# Patient Record
Sex: Female | Born: 1995
Health system: Southern US, Community
[De-identification: ages and names within clinical notes are randomized; demographics above are authoritative.]

## PROBLEM LIST (undated history)

## (undated) ENCOUNTER — Ambulatory Visit: Payer: Self-pay

## (undated) ENCOUNTER — Ambulatory Visit: Admission: EM | Payer: Self-pay

## (undated) ENCOUNTER — Ambulatory Visit: Payer: Self-pay | Source: Home / Self Care

## (undated) DIAGNOSIS — T7840XA Allergy, unspecified, initial encounter: Secondary | ICD-10-CM

## (undated) DIAGNOSIS — L732 Hidradenitis suppurativa: Secondary | ICD-10-CM

## (undated) DIAGNOSIS — L709 Acne, unspecified: Secondary | ICD-10-CM

## (undated) HISTORY — DX: Allergy, unspecified, initial encounter: T78.40XA

## (undated) HISTORY — DX: Acne, unspecified: L70.9

---
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
## 2013-01-25 ENCOUNTER — Encounter (HOSPITAL_COMMUNITY): Payer: Self-pay | Admitting: *Deleted

## 2013-01-25 ENCOUNTER — Emergency Department (HOSPITAL_COMMUNITY)
Admission: EM | Admit: 2013-01-25 | Discharge: 2013-01-25 | Disposition: A | Discharge status: Home / Self Care | Payer: Medicaid Other | Attending: Emergency Medicine | Admitting: Emergency Medicine

## 2013-01-25 DIAGNOSIS — J45909 Unspecified asthma, uncomplicated: Secondary | ICD-10-CM | POA: Insufficient documentation

## 2013-01-25 DIAGNOSIS — L0291 Cutaneous abscess, unspecified: Secondary | ICD-10-CM

## 2013-01-25 DIAGNOSIS — Z872 Personal history of diseases of the skin and subcutaneous tissue: Secondary | ICD-10-CM | POA: Insufficient documentation

## 2013-01-25 DIAGNOSIS — Z79899 Other long term (current) drug therapy: Secondary | ICD-10-CM | POA: Insufficient documentation

## 2013-01-25 DIAGNOSIS — L02219 Cutaneous abscess of trunk, unspecified: Secondary | ICD-10-CM | POA: Insufficient documentation

## 2013-01-25 MED ORDER — HYDROCODONE-ACETAMINOPHEN 5-325 MG PO TABS
1.0000 | ORAL_TABLET | Freq: Once | ORAL | Status: AC
  Administered 2013-01-25: 1 via ORAL
  Filled 2013-01-25: qty 1

## 2013-01-25 MED ORDER — SULFAMETHOXAZOLE-TRIMETHOPRIM 800-160 MG PO TABS: 1.0000 | ORAL_TABLET | Freq: Two times a day (BID) | ORAL | Status: DC

## 2013-01-25 MED ORDER — HYDROCODONE-ACETAMINOPHEN 5-325 MG PO TABS: ORAL_TABLET | ORAL | Status: DC

## 2013-01-25 NOTE — ED Provider Notes (Signed)
Medical screening examination/treatment/procedure(s) were performed by non-physician practitioner and as supervising physician I was immediately available for consultation/collaboration. Emalea Mix, MD, FACEP   Tonna Palazzi L Iya Hamed, MD 01/25/13 1608 

## 2013-01-25 NOTE — ED Notes (Signed)
Pt reports she started to have pain in her belly button Thursday where her piercing is. Pt reports possible infection.  Swelling and redness noted.

## 2013-01-25 NOTE — ED Provider Notes (Signed)
History    CSN: [id number] Arrival date & time 01/25/13  1104  First MD Initiated Contact with Patient 01/25/13 1119     Chief Complaint  Patient presents with  . Navel pain/piercing infected    (Consider location/radiation/quality/duration/timing/severity/associated sxs/prior Treatment) HPI Comments: Patient presents with complaint of naval swelling and pain that began 3 days ago. Patient had a piercing placed in this area several months ago. She is concerned of infection. No treatments prior to arrival. Area has not been draining. No fever or vomiting. Onset of symptoms gradual. Course is gradually worsening. Nothing makes symptoms better. Palpation makes pain worse.  The history is provided by the patient.   Past Medical History  Diagnosis Date  . 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 Acne     Duac gel per Derm, Dr. Nita Sells  . Asthma 2009    No symptoms since 2009. No med use since 2009   History reviewed. No pertinent past surgical history. Family History  Problem Relation Age of Onset  . Asthma Sister   . Stroke Maternal Grandfather   . Cancer Neg Hx   . Diabetes Neg Hx   . Heart disease Neg Hx   . Hyperlipidemia Neg Hx   . Hypertension Neg Hx    History  Substance Use Topics  . Smoking status: Never Smoker   . Smokeless tobacco: Never Used  . Alcohol Use: No   OB History   Grav Para Term Preterm Abortions TAB SAB Ect Mult Living                 Review of Systems  Constitutional: Negative for fever.  Gastrointestinal: Negative for nausea and vomiting.  Skin: Positive for color change.       Positive for abscess.  Hematological: Negative for adenopathy.    Allergies  Review of patient's allergies indicates no known allergies.  Home Medications   Current Outpatient Rx  Name  Route  Sig  Dispense  Refill  . EXPIRED: fluticasone (FLONASE) 50 MCG/ACT nasal spray   Nasal   Place 2 sprays into the nose daily.   16 g   12   .  HYDROcodone-acetaminophen (NORCO/VICODIN) 5-325 MG per tablet      Take 1-2 tablets every 6 hours as needed for severe pain   8 tablet   0   . EXPIRED: loratadine (CLARITIN) 10 MG tablet   Oral   Take 1 tablet (10 mg total) by mouth daily.   30 tablet   12   . sulfamethoxazole-trimethoprim (SEPTRA DS) 800-160 MG per tablet   Oral   Take 1 tablet by mouth every 12 (twelve) hours.   14 tablet   0    BP 118/65  Pulse 95  Temp(Src) 99 F (37.2 C) (Oral)  Resp 20  SpO2 100%  LMP 01/04/2013 Physical Exam  Nursing note and vitals reviewed. Constitutional: She appears well-developed and well-nourished.  HENT:  Head: Normocephalic and atraumatic.  Eyes: Conjunctivae are normal.  Neck: Normal range of motion. Neck supple.  Pulmonary/Chest: No respiratory distress.  Neurological: She is alert.  Skin: Skin is warm and dry.  There are 2 small areas of swelling where the piercing had entered and exited the skin at the inferior aspect of naval. These 2 areas are very fluctuant and tender. There is mild surrounding extension of the erythema consistent with cellulitis.  Psychiatric: She has a normal mood and affect.    ED Course  Procedures (including critical care time) Labs Reviewed - No data to display No results found. 1. Abscess and cellulitis    12:08 PM Patient seen and examined. Medications ordered.   Vital signs reviewed and are as follows: Filed Vitals:   01/25/13 1128  BP: 118/65  Pulse: 95  Temp: 99 F (37.2 C)  Resp: 20   INCISION AND DRAINAGE Performed by: Carolee Rota Consent: Verbal consent obtained. Risks and benefits: risks, benefits and alternatives were discussed Type: abscess  Body area: inferior aspect of umbilicus  Anesthesia: local infiltration  Incision was made with a 25g needle  Local anesthetic: lidocaine 2% without epinephrine  Anesthetic total: 1 ml  Complexity: complex  Drainage: purulent  Drainage amount: large  Packing  material: none  Patient tolerance: Patient tolerated the procedure well with no immediate complications.   12:08 PM The patient was urged to return to the Emergency Department urgently with worsening pain, swelling, expanding erythema especially if it streaks away from the affected area, fever, or if they have any other concerns.   The patient was urged to return to the Emergency Department or go to their PCP in 48 hours for wound recheck if the area is not significantly improved.  Counseled to take entire course of antibiotics as directed.  The patient verbalized understanding and stated agreement with this plan.   Patient counseled on use of narcotic pain medications. Counseled not to combine these medications with others containing tylenol. Urged not to drink alcohol, drive, or perform any other activities that requires focus while taking these medications. The patient verbalizes understanding and agrees with the plan.    MDM  Patient with abscess and cellulitis related to umbilicus piercing. I&D performed. Will treat with antibiotics given previous foreign body (now removed) and cellulitis. Patient appears well, nontoxic. No indication for parenteral antibiotics or admission to hospital.  Renne Crigler, PA-C 01/25/13 1214

## 2013-06-03 ENCOUNTER — Emergency Department (HOSPITAL_COMMUNITY): Payer: BC Managed Care – PPO

## 2013-06-03 ENCOUNTER — Encounter (HOSPITAL_COMMUNITY): Payer: Self-pay | Admitting: Emergency Medicine

## 2013-06-03 ENCOUNTER — Emergency Department (HOSPITAL_COMMUNITY)
Admission: EM | Admit: 2013-06-03 | Discharge: 2013-06-03 | Disposition: A | Discharge status: Home / Self Care | Payer: BC Managed Care – PPO | Attending: Emergency Medicine | Admitting: Emergency Medicine

## 2013-06-03 DIAGNOSIS — Y9389 Activity, other specified: Secondary | ICD-10-CM | POA: Insufficient documentation

## 2013-06-03 DIAGNOSIS — Y929 Unspecified place or not applicable: Secondary | ICD-10-CM | POA: Insufficient documentation

## 2013-06-03 DIAGNOSIS — IMO0002 Reserved for concepts with insufficient information to code with codable children: Secondary | ICD-10-CM | POA: Insufficient documentation

## 2013-06-03 DIAGNOSIS — S90122A Contusion of left lesser toe(s) without damage to nail, initial encounter: Secondary | ICD-10-CM

## 2013-06-03 DIAGNOSIS — S90129A Contusion of unspecified lesser toe(s) without damage to nail, initial encounter: Secondary | ICD-10-CM | POA: Insufficient documentation

## 2013-06-03 DIAGNOSIS — J45909 Unspecified asthma, uncomplicated: Secondary | ICD-10-CM | POA: Insufficient documentation

## 2013-06-03 DIAGNOSIS — Z872 Personal history of diseases of the skin and subcutaneous tissue: Secondary | ICD-10-CM | POA: Insufficient documentation

## 2013-06-03 MED ORDER — IBUPROFEN 800 MG PO TABS: 800.0000 mg | ORAL_TABLET | Freq: Three times a day (TID) | ORAL | Status: DC

## 2013-06-03 MED ORDER — HYDROCODONE-ACETAMINOPHEN 5-325 MG PO TABS
1.0000 | ORAL_TABLET | Freq: Once | ORAL | Status: AC
  Administered 2013-06-03: 1 via ORAL
  Filled 2013-06-03: qty 1

## 2013-06-03 NOTE — ED Notes (Signed)
Pt states she "banged" her L foot on a door today. Pt has pain to L pinkie toe. Pt arrives with toe buddy taped. Pt ambulatory with steady gait to room.

## 2013-06-03 NOTE — ED Provider Notes (Signed)
CSN: [id number]     Arrival date & time 06/03/13  2051 History  This chart was scribed for non-physician practitioner Kyung Bacca, PA-C working with Derwood Kaplan, MD by Caryn Bee, ED Scribe. This patient was seen in room WTR9/WTR9 and the patient's care was started at 10:03 PM.    Chief Complaint  Patient presents with  . Toe Injury   HPI HPI Comments: Caroline Hobbs is a 17 y.o. female who presents to the Emergency Department complaining of sudden onset, constant left small toe pain onset today after hitting it on the door. Pain is worsened by movement and bearing weight. She has lost some feeling at the tip of her toe. Pt took tylenol around 6:00 PM with no relief.   Past Medical History  Diagnosis Date  . 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 Acne     Duac gel per Derm, Dr. Nita Sells  . Asthma 2009    No symptoms since 2009. No med use since 2009   History reviewed. No pertinent past surgical history. Family History  Problem Relation Age of Onset  . Asthma Sister   . Stroke Maternal Grandfather   . Cancer Neg Hx   . Diabetes Neg Hx   . Heart disease Neg Hx   . Hyperlipidemia Neg Hx   . Hypertension Neg Hx    History  Substance Use Topics  . Smoking status: Never Smoker   . Smokeless tobacco: Never Used  . Alcohol Use: No   OB History   Grav Para Term Preterm Abortions TAB SAB Ect Mult Living                 Review of Systems  All other systems reviewed and are negative.    Allergies  Review of patient's allergies indicates no known allergies.  Home Medications   Current Outpatient Rx  Name  Route  Sig  Dispense  Refill  . EXPIRED: loratadine (CLARITIN) 10 MG tablet   Oral   Take 1 tablet (10 mg total) by mouth daily.   30 tablet   12    Triage Vitals: BP 138/73  Pulse 88  Temp(Src) 98.7 F (37.1 C) (Oral)  Resp 16  SpO2 100%  Physical Exam  Musculoskeletal:  L pinky toe edematous and ecchymotic.  Tenderness entire toe  as well as base of 5th metatarsal.  Pain w/ minimal passive flexion of toe.  Distal sensation decreased.  Rest of toes and foot nml.      ED Course  Procedures (including critical care time) DIAGNOSTIC STUDIES: Oxygen Saturation is 100% on room air, normal by my interpretation.    COORDINATION OF CARE: 10:08 PM-Discussed treatment plan with pt at bedside and pt agreed to plan.   Labs Review Labs Reviewed - No data to display Imaging Review Dg Toe 5th Left  06/03/2013   CLINICAL DATA:  Kicked door with left foot, pain in 5th toe  EXAM: DG TOE 5TH LEFT  COMPARISON:  None.  FINDINGS: There is no evidence of fracture or dislocation. There is no evidence of arthropathy or other focal bone abnormality. Soft tissues are unremarkable.  IMPRESSION: Negative.   Electronically Signed   By: Esperanza Heir M.D.   On: 06/03/2013 21:58    EKG Interpretation   None       MDM   1. Contusion, toe, left, initial encounter    [name] presents w/ L toe injury.  Xray neg  for fx/dislocation.  Ortho tech buddy taped and fitted her with post-op shoe for comfort.  Recommended NSAID and RICE.  10:13 PM   I personally performed the services described in this documentation, which was scribed in my presence. The recorded information has been reviewed and is accurate.    Otilio Miu, PA-C 06/03/13 2215

## 2013-06-03 NOTE — ED Notes (Signed)
Pt has a ride home.  

## 2013-06-06 NOTE — ED Provider Notes (Signed)
Medical screening examination/treatment/procedure(s) were performed by non-physician practitioner and as supervising physician I was immediately available for consultation/collaboration.  EKG Interpretation   None        Derwood Kaplan, MD 06/06/13 [id number]

## 2013-08-19 ENCOUNTER — Telehealth (HOSPITAL_COMMUNITY): Payer: Self-pay | Admitting: Emergency Medicine

## 2013-08-19 ENCOUNTER — Other Ambulatory Visit (HOSPITAL_COMMUNITY)
Admission: RE | Admit: 2013-08-19 | Discharge: 2013-08-19 | Disposition: A | Discharge status: Home / Self Care | Payer: PRIVATE HEALTH INSURANCE | Source: Ambulatory Visit | Attending: Emergency Medicine | Admitting: Emergency Medicine

## 2013-08-19 ENCOUNTER — Encounter (HOSPITAL_COMMUNITY): Payer: Self-pay | Admitting: Emergency Medicine

## 2013-08-19 ENCOUNTER — Emergency Department (INDEPENDENT_AMBULATORY_CARE_PROVIDER_SITE_OTHER)
Admission: EM | Admit: 2013-08-19 | Discharge: 2013-08-19 | Disposition: A | Discharge status: Home / Self Care | Payer: Self-pay | Source: Home / Self Care | Attending: Emergency Medicine | Admitting: Emergency Medicine

## 2013-08-19 DIAGNOSIS — A088 Other specified intestinal infections: Secondary | ICD-10-CM

## 2013-08-19 DIAGNOSIS — A084 Viral intestinal infection, unspecified: Secondary | ICD-10-CM

## 2013-08-19 DIAGNOSIS — Z113 Encounter for screening for infections with a predominantly sexual mode of transmission: Secondary | ICD-10-CM | POA: Insufficient documentation

## 2013-08-19 DIAGNOSIS — N760 Acute vaginitis: Secondary | ICD-10-CM | POA: Insufficient documentation

## 2013-08-19 LAB — CBC WITH DIFFERENTIAL/PLATELET
BAND NEUTROPHILS: 0 % (ref 0–10)
BASOS PCT: 0 % (ref 0–1)
BLASTS: 0 %
Basophils Absolute: 0 10*3/uL (ref 0.0–0.1)
Eosinophils Absolute: 0 10*3/uL (ref 0.0–0.7)
Eosinophils Relative: 0 % (ref 0–5)
HEMATOCRIT: 39.7 % (ref 36.0–46.0)
HEMOGLOBIN: 12.8 g/dL (ref 12.0–15.0)
LYMPHS ABS: 11.8 10*3/uL — AB (ref 0.7–4.0)
LYMPHS PCT: 85 % — AB (ref 12–46)
MCH: 24 pg — ABNORMAL LOW (ref 26.0–34.0)
MCHC: 32.2 g/dL (ref 30.0–36.0)
MCV: 74.3 fL — ABNORMAL LOW (ref 78.0–100.0)
MONO ABS: 0.3 10*3/uL (ref 0.1–1.0)
MONOS PCT: 2 % — AB (ref 3–12)
Metamyelocytes Relative: 0 %
Myelocytes: 0 %
Neutro Abs: 1.8 10*3/uL (ref 1.7–7.7)
Neutrophils Relative %: 13 % — ABNORMAL LOW (ref 43–77)
Platelets: 150 10*3/uL (ref 150–400)
Promyelocytes Absolute: 0 %
RBC: 5.34 MIL/uL — AB (ref 3.87–5.11)
RDW: 13.1 % (ref 11.5–15.5)
WBC: 13.9 10*3/uL — AB (ref 4.0–10.5)
nRBC: 0 /100 WBC

## 2013-08-19 LAB — POCT URINALYSIS DIP (DEVICE)
GLUCOSE, UA: NEGATIVE mg/dL
HGB URINE DIPSTICK: NEGATIVE
Nitrite: NEGATIVE
Protein, ur: 100 mg/dL — AB
Specific Gravity, Urine: >=1.03 (ref 1.005–1.030)
Urobilinogen, UA: >=8 mg/dL (ref 0.0–1.0)
pH: 5.5 (ref 5.0–8.0)

## 2013-08-19 LAB — POCT PREGNANCY, URINE: Preg Test, Ur: NEGATIVE

## 2013-08-19 MED ORDER — DICYCLOMINE HCL 20 MG PO TABS: 20.0000 mg | ORAL_TABLET | Freq: Two times a day (BID) | ORAL | Status: DC

## 2013-08-19 MED ORDER — METRONIDAZOLE 500 MG PO TABS: 500.0000 mg | ORAL_TABLET | Freq: Two times a day (BID) | ORAL | Status: DC

## 2013-08-19 MED ORDER — FLUCONAZOLE 150 MG PO TABS: 150.0000 mg | ORAL_TABLET | Freq: Once | ORAL | Status: DC

## 2013-08-19 MED ORDER — DIPHENOXYLATE-ATROPINE 2.5-0.025 MG PO TABS: 1.0000 | ORAL_TABLET | Freq: Four times a day (QID) | ORAL | Status: DC | PRN

## 2013-08-19 NOTE — ED Notes (Signed)
Lower abdominal pain R>L; NAD, w/d/color good

## 2013-08-19 NOTE — ED Notes (Signed)
DNA probes for Gardnerella and Candida were positive. She was not treated, so we will need to treat with metronidazole 500 mg #14 1 twice a day for 1 week, and Diflucan 150 mg #1, one time only. We will need to call and inform the patient of this result. Prescription to be sent to her pharmacy at [facility] [address]  Reuben Likesavid C Sanayah Munro, MD 08/19/13 2215

## 2013-08-19 NOTE — Discharge Instructions (Signed)

## 2013-08-19 NOTE — ED Provider Notes (Signed)
Chief Complaint   Chief Complaint  Patient presents with  . Abdominal Pain    History of Present Illness   Caroline Hobbs is a 18 year old female who has had a two-day history of fever up to 101.2, chills, myalgias, crampy, bilateral lower, pain, headache, rhinorrhea, diarrhea with a small amount of blood in, vaginal itching, discharge, and older. She denies any nausea, vomiting, anorexia, or urinary symptoms. No abnormal vaginal bleeding. Patient's last menstrual period began December 31. She is sexually active without use of birth control.  Review of Systems   Other than as noted above, the patient denies any of the following symptoms: Constitutional:  No fever, chills, fatigue, weight loss or anorexia. Lungs:  No cough or shortness of breath. Heart:  No chest pain, palpitations, syncope or edema.  No cardiac history. Abdomen:  No nausea, vomiting, hematememesis, melena, diarrhea, or hematochezia. GU:  No dysuria, frequency, urgency, or hematuria. Gyn:  No vaginal discharge, itching, abnormal bleeding, dyspareunia, or pelvic pain.  PMFSH   Past medical history, family history, social history, meds, and allergies were reviewed along with nurse's notes.  No prior abdominal surgeries, past history of GI problems, STDs or GYN problems.  No history of aspirin or NSAID use.  No excessive alcohol intake.   Physical Exam     Vital signs:  BP 109/74  Pulse 92  Temp(Src) 98.1 F (36.7 C) (Oral)  Resp 18  SpO2 100% Gen:  Alert, oriented, in no distress. Lungs:  Breath sounds clear and equal bilaterally.  No wheezes, rales or rhonchi. Heart:  Regular rhythm.  No gallops or murmers.   Abdomen:  Soft, flat, nondistended. No organomegaly or mass. Bowel sounds are hyperactive. She has mild tenderness to palpation in the suprapubic area and in the right lower quadrant without guarding or rebound. No organomegaly or mass. Pelvic:  Normal external genitalia, there was a small amount of  vaginal discharge, cervix and uterus were normal. There was no pain on cervical motion. Uterus was normal in size and shape and nontender. No adnexal tenderness or mass. DNA probe for gonorrhea, Chlamydia, Trichomonas, Gardnerella, Candida were obtained. Skin:  Clear, warm and dry.  No rash.  Labs   Results for orders placed during the hospital encounter of 08/19/13  CBC WITH DIFFERENTIAL      Result Value Range   WBC 13.9 (*) 4.0 - 10.5 K/uL   RBC 5.34 (*) 3.87 - 5.11 MIL/uL   Hemoglobin 12.8  12.0 - 15.0 g/dL   HCT 16.1  09.6 - 04.5 %   MCV 74.3 (*) 78.0 - 100.0 fL   MCH 24.0 (*) 26.0 - 34.0 pg   MCHC 32.2  30.0 - 36.0 g/dL   RDW 40.9  81.1 - 91.4 %   Platelets 150  150 - 400 K/uL   Neutrophils Relative % 13 (*) 43 - 77 %   Lymphocytes Relative 85 (*) 12 - 46 %   Monocytes Relative 2 (*) 3 - 12 %   Eosinophils Relative 0  0 - 5 %   Basophils Relative 0  0 - 1 %   Band Neutrophils 0  0 - 10 %   Metamyelocytes Relative 0     Myelocytes 0     Promyelocytes Absolute 0     Blasts 0     nRBC 0  0 /100 WBC   Neutro Abs 1.8  1.7 - 7.7 K/uL   Lymphs Abs 11.8 (*) 0.7 - 4.0 K/uL   Monocytes  Absolute 0.3  0.1 - 1.0 K/uL   Eosinophils Absolute 0.0  0.0 - 0.7 K/uL   Basophils Absolute 0.0  0.0 - 0.1 K/uL   WBC Morphology ATYPICAL LYMPHOCYTES    POCT URINALYSIS DIP (DEVICE)      Result Value Range   Glucose, UA NEGATIVE  NEGATIVE mg/dL   Bilirubin Urine MODERATE (*) NEGATIVE   Ketones, ur TRACE (*) NEGATIVE mg/dL   Specific Gravity, Urine >=1.030  1.005 - 1.030   Hgb urine dipstick NEGATIVE  NEGATIVE   pH 5.5  5.0 - 8.0   Protein, ur 100 (*) NEGATIVE mg/dL   Urobilinogen, UA >=8.2>=8.0  0.0 - 1.0 mg/dL   Nitrite NEGATIVE  NEGATIVE   Leukocytes, UA TRACE (*) NEGATIVE  POCT PREGNANCY, URINE      Result Value Range   Preg Test, Ur NEGATIVE  NEGATIVE    Assessment   The encounter diagnosis was Viral gastroenteritis.  Her white count was mildly elevated, however there was a lymphocyte  predominance suggesting a viral condition. She also has a mild vaginal discharge. Will await results of DNA testing.  Plan     1.  Meds:  The following meds were prescribed:   Discharge Medication List as of 08/19/2013  3:05 PM    START taking these medications   Details  dicyclomine (BENTYL) 20 MG tablet Take 1 tablet (20 mg total) by mouth 2 (two) times daily., Starting 08/19/2013, Until Discontinued, Normal    diphenoxylate-atropine (LOMOTIL) 2.5-0.025 MG per tablet Take 1 tablet by mouth 4 (four) times daily as needed for diarrhea or loose stools., Starting 08/19/2013, Until Discontinued, Print        2.  Patient Education/Counseling:  The patient was given appropriate handouts, self care instructions, and instructed in symptomatic relief.  Suggested clear liquids today, advancing to a brat diet tomorrow.  3.  Follow up:  The patient was told to follow up here if no better in 3 to 4 days, or sooner if becoming worse in any way, and given some red flag symptoms such as worsening pain, fever, vomiting, or evidence of GI bleeding which would prompt immediate return.  Follow up here as necessary.    Reuben Likesavid C Nylen Creque, MD 08/19/13 2214

## 2013-08-20 LAB — PATHOLOGIST SMEAR REVIEW: PATH REVIEW: REACTIVE

## 2013-08-21 ENCOUNTER — Telehealth (HOSPITAL_COMMUNITY): Payer: Self-pay | Admitting: *Deleted

## 2013-08-21 NOTE — ED Notes (Signed)
GC/Chlamydia neg., Affirm: Candida and Gardnerella pos., Trich neg.  Dr. Lorenz CoasterKeller e-prescribed Diflucan and Flagyl to pt.'s pharmacy.  I called pt. Pt. verified x 2 and given results.  Pt. told she needs Diflucan for yeast and Flagyl for bacterial vaginosis and where to pick up her Rx.'s.   Pt. instructed to no alcohol while taking the Flagyl.  Pt.'s questions answered. Pt. voiced understanding. Vassie MoselleYork, Tylea Hise M 08/21/2013

## 2013-08-25 NOTE — ED Notes (Signed)
1/25 No further action needed with path smear per Dr. Lorenz CoasterKeller. Caroline Hobbs, [name] M

## 2014-02-12 ENCOUNTER — Encounter (HOSPITAL_COMMUNITY): Payer: Self-pay | Admitting: Emergency Medicine

## 2014-02-12 ENCOUNTER — Emergency Department (INDEPENDENT_AMBULATORY_CARE_PROVIDER_SITE_OTHER)
Admission: EM | Admit: 2014-02-12 | Discharge: 2014-02-12 | Discharge status: Against Medical Advice | Payer: PRIVATE HEALTH INSURANCE | Source: Home / Self Care | Attending: Family Medicine | Admitting: Family Medicine

## 2014-02-12 DIAGNOSIS — R51 Headache: Secondary | ICD-10-CM

## 2014-02-12 DIAGNOSIS — R519 Headache, unspecified: Secondary | ICD-10-CM

## 2014-02-12 DIAGNOSIS — H53149 Visual discomfort, unspecified: Secondary | ICD-10-CM

## 2014-02-12 MED ORDER — ACETAMINOPHEN 325 MG PO TABS
ORAL_TABLET | ORAL | Status: AC
  Filled 2014-02-12: qty 3

## 2014-02-12 MED ORDER — ACETAMINOPHEN 500 MG PO TABS
1000.0000 mg | ORAL_TABLET | Freq: Once | ORAL | Status: AC
  Administered 2014-02-12: 1000 mg via ORAL

## 2014-02-12 NOTE — ED Notes (Signed)
Pt  Given   A  Note    Saying  She  Was  Here  Only        And  ama  Form  Signed  And  Scanned  Into  The  Record

## 2014-02-12 NOTE — ED Notes (Signed)
When    Caroline Hobbs  Took the  Pt to  The  Er        By  Shuttle       Pt  Refused      And    Lorriane Shirebraham  Brought pt  Back  And  Pt  Was   Placed  In  Exam room and  zack    Spoke  To  The  Pt

## 2014-02-12 NOTE — ED Notes (Signed)
Pt  Requested  A  Note  For  Work     -  She was  Advised  That  When she  Was   Discharged    The  Er would  Give  Her a  Work  Note  Depending  On  What  The  Findings  Of  Her  evaualtion  Was     - pt  Stated  She  Was  Not   Going  To stay in the  Er  All  Day   -  Pt  Advised  The  Importance     Of  Completing  The     Plan of  Care    And  Not  To leave

## 2014-02-12 NOTE — ED Provider Notes (Signed)
CSN: [id number]     Arrival date & time 02/12/14  1234 History   First MD Initiated Contact with Patient 02/12/14 1244     Chief Complaint  Patient presents with  . Headache   (Consider location/radiation/quality/duration/timing/severity/associated sxs/prior Treatment) HPI Comments: 18 year old female presents complaining of the worst headache of her life. This started 2 days ago and has gotten progressively worse. It started as a mild headache, but then got abruptly worse and has gotten even worse since then. She feels the headache across her forehead and behind both eyes. She has associated photophobia. She denies neck stiffness, nausea, vomiting. She has no significant history of headaches and says this is very abnormal for her. She denies any trauma. She does not take any blood thinners. She took ibuprofen and that did not help. She currently rates her pain as 8/10 in severity.  Patient is a 18 y.o. female presenting with headaches.  Headache Associated symptoms: photophobia     Past Medical History  Diagnosis Date  . 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 Acne     Duac gel per Derm, Dr. Nita Sells  . Asthma 2009    No symptoms since 2009. No med use since 2009   History reviewed. No pertinent past surgical history. Family History  Problem Relation Age of Onset  . Asthma Sister   . Stroke Maternal Grandfather   . Cancer Neg Hx   . Diabetes Neg Hx   . Heart disease Neg Hx   . Hyperlipidemia Neg Hx   . Hypertension Neg Hx    History  Substance Use Topics  . Smoking status: Never Smoker   . Smokeless tobacco: Never Used  . Alcohol Use: No   OB History   Grav Para Term Preterm Abortions TAB SAB Ect Mult Living                 Review of Systems  Eyes: Positive for photophobia.  Neurological: Positive for headaches.  All other systems reviewed and are negative.   Allergies  Review of patient's allergies indicates no known allergies.  Home Medications    Prior to Admission medications   Medication Sig Start Date End Date Taking? Authorizing Provider  dicyclomine (BENTYL) 20 MG tablet Take 1 tablet (20 mg total) by mouth 2 (two) times daily. 08/19/13   Reuben Likes, MD  diphenoxylate-atropine (LOMOTIL) 2.5-0.025 MG per tablet Take 1 tablet by mouth 4 (four) times daily as needed for diarrhea or loose stools. 08/19/13   Reuben Likes, MD  fluconazole (DIFLUCAN) 150 MG tablet Take 1 tablet (150 mg total) by mouth once. 08/19/13   Reuben Likes, MD  ibuprofen (ADVIL,MOTRIN) 800 MG tablet Take 1 tablet (800 mg total) by mouth 3 (three) times daily. 06/03/13   Arie Sabina Schinlever, PA-C  loratadine (CLARITIN) 10 MG tablet Take 1 tablet (10 mg total) by mouth daily. 12/04/11 12/03/12  Faylene Kurtz, MD  metroNIDAZOLE (FLAGYL) 500 MG tablet Take 1 tablet (500 mg total) by mouth 2 (two) times daily. 08/19/13   Reuben Likes, MD   BP 128/83  Pulse 88  Temp(Src) 98.9 F (37.2 C) (Oral)  Resp 16  SpO2 100%  LMP 02/06/2014 Physical Exam  Nursing note and vitals reviewed. Constitutional: She is oriented to person, place, and time. Vital signs are normal. She appears well-developed and well-nourished. No distress.  Pt in darkened room, holding head, slightly rocking, very uncomfortable  HENT:  Head: Normocephalic  and atraumatic.  Pulmonary/Chest: Effort normal. No respiratory distress.  Neurological: She is alert and oriented to person, place, and time. She has normal strength. Coordination normal.  Skin: Skin is warm and dry. No rash noted. She is not diaphoretic.  Psychiatric: She has a normal mood and affect. Judgment normal.    ED Course  Procedures (including critical care time) Labs Review Labs Reviewed - No data to display  Imaging Review No results found.   MDM   1. Headache around the eyes   2. Photophobia    Patient complaining of worse headache of her life. Was not thunderclap in onset, but did have an abrupt worsening. Rule  out SAH. Transferred to ED. Given 1000 mg of Tylenol prior to transfer    Graylon GoodZachary H Susana Gripp, PA-C 02/12/14 1329

## 2014-02-12 NOTE — ED Notes (Signed)
Pt  Reports  Symptoms  Of  Headache  With  Some  Photophobia      X  2  Days    Symptoms  Not  releived  By otc  meds   Such  As  motrin

## 2014-02-14 NOTE — ED Provider Notes (Signed)
Medical screening examination/treatment/procedure(s) were performed by a resident physician or non-physician practitioner and as the supervising physician I was immediately available for consultation/collaboration.  Evan Corey, MD    Evan S Corey, MD 02/14/14 0846 

## 2014-07-31 ENCOUNTER — Emergency Department (HOSPITAL_COMMUNITY)
Admission: EM | Admit: 2014-07-31 | Discharge: 2014-07-31 | Disposition: A | Discharge status: Home / Self Care | Payer: PRIVATE HEALTH INSURANCE | Source: Home / Self Care | Attending: Family Medicine | Admitting: Family Medicine

## 2014-07-31 ENCOUNTER — Encounter (HOSPITAL_COMMUNITY): Payer: Self-pay | Admitting: Emergency Medicine

## 2014-07-31 ENCOUNTER — Other Ambulatory Visit (HOSPITAL_COMMUNITY)
Admission: RE | Admit: 2014-07-31 | Discharge: 2014-07-31 | Disposition: A | Discharge status: Home / Self Care | Payer: PRIVATE HEALTH INSURANCE | Source: Ambulatory Visit | Attending: Family Medicine | Admitting: Family Medicine

## 2014-07-31 DIAGNOSIS — Z113 Encounter for screening for infections with a predominantly sexual mode of transmission: Secondary | ICD-10-CM | POA: Insufficient documentation

## 2014-07-31 DIAGNOSIS — N760 Acute vaginitis: Secondary | ICD-10-CM | POA: Diagnosis present

## 2014-07-31 DIAGNOSIS — N3001 Acute cystitis with hematuria: Secondary | ICD-10-CM

## 2014-07-31 LAB — POCT URINALYSIS DIP (DEVICE)
GLUCOSE, UA: NEGATIVE mg/dL
Nitrite: NEGATIVE
Protein, ur: 30 mg/dL — AB
Specific Gravity, Urine: >=1.03 (ref 1.005–1.030)
UROBILINOGEN UA: 1 mg/dL (ref 0.0–1.0)
pH: 5.5 (ref 5.0–8.0)

## 2014-07-31 LAB — POCT PREGNANCY, URINE: PREG TEST UR: NEGATIVE

## 2014-07-31 MED ORDER — FLUCONAZOLE 150 MG PO TABS: 150.0000 mg | ORAL_TABLET | Freq: Once | ORAL | Status: DC

## 2014-07-31 MED ORDER — CEPHALEXIN 500 MG PO CAPS: 500.0000 mg | ORAL_CAPSULE | Freq: Two times a day (BID) | ORAL | Status: DC

## 2014-07-31 MED ORDER — METRONIDAZOLE 500 MG PO TABS: 500.0000 mg | ORAL_TABLET | Freq: Two times a day (BID) | ORAL | Status: DC

## 2014-07-31 NOTE — ED Provider Notes (Signed)
Caroline Hobbs is a 19 y.o. female who presents to Urgent Care today for vaginal spotting. Patient had a normal period on December 20 and has had some spotting off and on from the 26th of the 30th into including today. She notes mild pelvic pain and pressure and had one episode of vomiting. She had some resolving cough and congestion as well. No significant vaginal discharge. No abdominal pain or diarrhea.   Past Medical History  Diagnosis Date  . 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 Acne     Duac gel per Derm, Dr. Nita Sells  . Asthma 2009    No symptoms since 2009. No med use since 2009   History reviewed. No pertinent past surgical history. History  Substance Use Topics  . Smoking status: Never Smoker   . Smokeless tobacco: Never Used  . Alcohol Use: No   ROS as above Medications: No current facility-administered medications for this encounter.   Current Outpatient Prescriptions  Medication Sig Dispense Refill  . cephALEXin (KEFLEX) 500 MG capsule Take 1 capsule (500 mg total) by mouth 2 (two) times daily. 14 capsule 0  . dicyclomine (BENTYL) 20 MG tablet Take 1 tablet (20 mg total) by mouth 2 (two) times daily. 20 tablet 0  . diphenoxylate-atropine (LOMOTIL) 2.5-0.025 MG per tablet Take 1 tablet by mouth 4 (four) times daily as needed for diarrhea or loose stools. 16 tablet 0  . fluconazole (DIFLUCAN) 150 MG tablet Take 1 tablet (150 mg total) by mouth once. 1 tablet 1  . ibuprofen (ADVIL,MOTRIN) 800 MG tablet Take 1 tablet (800 mg total) by mouth 3 (three) times daily. 12 tablet 0  . loratadine (CLARITIN) 10 MG tablet Take 1 tablet (10 mg total) by mouth daily. 30 tablet 12  . metroNIDAZOLE (FLAGYL) 500 MG tablet Take 1 tablet (500 mg total) by mouth 2 (two) times daily. 14 tablet 0   No Known Allergies   Exam:  BP 137/92 mmHg  Pulse 97  Temp(Src) 97.3 F (36.3 C) (Oral)  Resp 18  SpO2 97%  LMP 07/21/2014 Gen: Well NAD HEENT: EOMI,  MMM Lungs: Normal  work of breathing. CTABL Heart: RRR no MRG Abd: NABS, Soft. Nondistended, Nontender no CV angle tenderness to percussion Exts: Brisk capillary refill, warm and well perfused.  GYN: No mole external genitalia. Vaginal canal with thin white discharge. Normal-appearing cervix. Positive whiff test.  Results for orders placed or performed during the hospital encounter of 07/31/14 (from the past 24 hour(s))  POCT urinalysis dip (device)     Status: Abnormal   Collection Time: 07/31/14  9:25 AM  Result Value Ref Range   Glucose, UA NEGATIVE NEGATIVE mg/dL   Bilirubin Urine MODERATE (A) NEGATIVE   Ketones, ur TRACE (A) NEGATIVE mg/dL   Specific Gravity, Urine >=1.030 1.005 - 1.030   Hgb urine dipstick MODERATE (A) NEGATIVE   pH 5.5 5.0 - 8.0   Protein, ur 30 (A) NEGATIVE mg/dL   Urobilinogen, UA 1.0 0.0 - 1.0 mg/dL   Nitrite NEGATIVE NEGATIVE   Leukocytes, UA SMALL (A) NEGATIVE  Pregnancy, urine POC     Status: None   Collection Time: 07/31/14  9:35 AM  Result Value Ref Range   Preg Test, Ur NEGATIVE NEGATIVE   No results found.  Assessment and Plan: 19 y.o. female with irregular menstruation with probable UTI and vaginitis. Urine culture pending. Treatment with Keflex fluconazole and metronidazole. Vaginal cytology pending.  Discussed warning signs or  symptoms. Please see discharge instructions. Patient expresses understanding.     Rodolph Bong, MD 07/31/14 [phone]

## 2014-07-31 NOTE — ED Notes (Signed)
C/o intermittent vag bleeding Reports LMP was from 12/20 - 12/23 Than she began to spot on the 1226, 12/30, and today Sx also include mild pelvic pain w/pressure, n/v Alert, no signs of acute distress.

## 2014-07-31 NOTE — Discharge Instructions (Signed)
Thank you for coming in today. If your belly pain worsens, or you have high fever, bad vomiting, blood in your stool or black tarry stool go to the Emergency Room.   Urinary Tract Infection Urinary tract infections (UTIs) can develop anywhere along your urinary tract. Your urinary tract is your body's drainage system for removing wastes and extra water. Your urinary tract includes two kidneys, two ureters, a bladder, and a urethra. Your kidneys are a pair of bean-shaped organs. Each kidney is about the size of your fist. They are located below your ribs, one on each side of your spine. CAUSES Infections are caused by microbes, which are microscopic organisms, including fungi, viruses, and bacteria. These organisms are so small that they can only be seen through a microscope. Bacteria are the microbes that most commonly cause UTIs. SYMPTOMS  Symptoms of UTIs may vary by age and gender of the patient and by the location of the infection. Symptoms in young women typically include a frequent and intense urge to urinate and a painful, burning feeling in the bladder or urethra during urination. Older women and men are more likely to be tired, shaky, and weak and have muscle aches and abdominal pain. A fever may mean the infection is in your kidneys. Other symptoms of a kidney infection include pain in your back or sides below the ribs, nausea, and vomiting. DIAGNOSIS To diagnose a UTI, your caregiver will ask you about your symptoms. Your caregiver also will ask to provide a urine sample. The urine sample will be tested for bacteria and white blood cells. White blood cells are made by your body to help fight infection. TREATMENT  Typically, UTIs can be treated with medication. Because most UTIs are caused by a bacterial infection, they usually can be treated with the use of antibiotics. The choice of antibiotic and length of treatment depend on your symptoms and the type of bacteria causing your  infection. HOME CARE INSTRUCTIONS  If you were prescribed antibiotics, take them exactly as your caregiver instructs you. Finish the medication even if you feel better after you have only taken some of the medication.  Drink enough water and fluids to keep your urine clear or pale yellow.  Avoid caffeine, tea, and carbonated beverages. They tend to irritate your bladder.  Empty your bladder often. Avoid holding urine for long periods of time.  Empty your bladder before and after sexual intercourse.  After a bowel movement, women should cleanse from front to back. Use each tissue only once. SEEK MEDICAL CARE IF:   You have back pain.  You develop a fever.  Your symptoms do not begin to resolve within 3 days. SEEK IMMEDIATE MEDICAL CARE IF:   You have severe back pain or lower abdominal pain.  You develop chills.  You have nausea or vomiting.  You have continued burning or discomfort with urination. MAKE SURE YOU:   Understand these instructions.  Will watch your condition.  Will get help right away if you are not doing well or get worse. Document Released: 04/25/2005 Document Revised: 01/15/2012 Document Reviewed: 08/24/2011 ExitCare Patient Information 2015 [employer]. This information is not intended to replace advice given to you by your health care provider. Make sure you discuss any questions you have with your health care provider.  

## 2014-07-31 NOTE — ED Notes (Signed)
Call back number verified.  

## 2014-08-01 LAB — URINE CULTURE: SPECIAL REQUESTS: NORMAL

## 2014-08-02 LAB — CERVICOVAGINAL ANCILLARY ONLY
Chlamydia: NEGATIVE
Neisseria Gonorrhea: NEGATIVE
Wet Prep (BD Affirm): NEGATIVE
Wet Prep (BD Affirm): POSITIVE — AB
Wet Prep (BD Affirm): POSITIVE — AB

## 2014-08-04 ENCOUNTER — Telehealth (HOSPITAL_COMMUNITY): Payer: Self-pay | Admitting: *Deleted

## 2014-08-04 NOTE — ED Notes (Signed)
GC/Chlamydia neg., Affirm: Candida neg. Gardnerella and Trich pos.  I called pt. Pt. verified x 2 and given results.  Pt. told she was adequately treated for both infections with Flagyl.  Pt. instructed to notify her partner to be tested for Trich and treated with Flagyl, no sex until she has finished her medication and her partner has been treated and to practice safe sex. Pt. told that [facility], by appointment. Vassie MoselleYork, Teddie Curd M 08/04/2014

## 2015-01-14 ENCOUNTER — Encounter (HOSPITAL_COMMUNITY): Payer: Self-pay | Admitting: Physical Medicine and Rehabilitation

## 2015-01-14 ENCOUNTER — Emergency Department (HOSPITAL_COMMUNITY)
Admission: EM | Admit: 2015-01-14 | Discharge: 2015-01-14 | Disposition: A | Discharge status: Home / Self Care | Payer: PRIVATE HEALTH INSURANCE | Attending: Emergency Medicine | Admitting: Emergency Medicine

## 2015-01-14 DIAGNOSIS — Z872 Personal history of diseases of the skin and subcutaneous tissue: Secondary | ICD-10-CM | POA: Insufficient documentation

## 2015-01-14 DIAGNOSIS — J45909 Unspecified asthma, uncomplicated: Secondary | ICD-10-CM | POA: Insufficient documentation

## 2015-01-14 DIAGNOSIS — R591 Generalized enlarged lymph nodes: Secondary | ICD-10-CM | POA: Insufficient documentation

## 2015-01-14 DIAGNOSIS — K047 Periapical abscess without sinus: Secondary | ICD-10-CM | POA: Insufficient documentation

## 2015-01-14 MED ORDER — ONDANSETRON HCL 4 MG/2ML IJ SOLN
4.0000 mg | Freq: Once | INTRAMUSCULAR | Status: AC
  Administered 2015-01-14: 4 mg via INTRAVENOUS
  Filled 2015-01-14: qty 2

## 2015-01-14 MED ORDER — SODIUM CHLORIDE 0.9 % IV SOLN
3.0000 g | Freq: Once | INTRAVENOUS | Status: AC
  Administered 2015-01-14: 3 g via INTRAVENOUS
  Filled 2015-01-14: qty 3

## 2015-01-14 MED ORDER — DEXAMETHASONE SODIUM PHOSPHATE 10 MG/ML IJ SOLN
10.0000 mg | Freq: Once | INTRAMUSCULAR | Status: AC
  Administered 2015-01-14: 10 mg via INTRAVENOUS
  Filled 2015-01-14: qty 1

## 2015-01-14 MED ORDER — HYDROCODONE-ACETAMINOPHEN 5-325 MG PO TABS: 1.0000 | ORAL_TABLET | ORAL | Status: DC | PRN

## 2015-01-14 MED ORDER — MORPHINE SULFATE 4 MG/ML IJ SOLN
4.0000 mg | Freq: Once | INTRAMUSCULAR | Status: AC
  Administered 2015-01-14: 4 mg via INTRAVENOUS
  Filled 2015-01-14: qty 1

## 2015-01-14 MED ORDER — SODIUM CHLORIDE 0.9 % IV BOLUS (SEPSIS)
1000.0000 mL | Freq: Once | INTRAVENOUS | Status: AC
  Administered 2015-01-14: 1000 mL via INTRAVENOUS

## 2015-01-14 MED ORDER — PENICILLIN V POTASSIUM 500 MG PO TABS: 500.0000 mg | ORAL_TABLET | Freq: Four times a day (QID) | ORAL | Status: DC

## 2015-01-14 MED ORDER — PREDNISONE 20 MG PO TABS: 40.0000 mg | ORAL_TABLET | Freq: Every day | ORAL | Status: DC

## 2015-01-14 MED ORDER — NAPROXEN 250 MG PO TABS: 250.0000 mg | ORAL_TABLET | Freq: Two times a day (BID) | ORAL | Status: DC

## 2015-01-14 NOTE — ED Notes (Signed)
I gave the patient a cup of ice, a coke, and a pack of graham crackers per nurse Irving Burton.

## 2015-01-14 NOTE — ED Notes (Signed)
Pt presents to department for evaluation of L lower dental pain and L sided facial swelling. Ongoing x2 days. 8/10 pain upon arrival to ED.

## 2015-01-14 NOTE — ED Provider Notes (Signed)
CSN: [id number]     Arrival date & time 01/14/15  1025 History   First MD Initiated Contact with Patient 01/14/15 1034     Chief Complaint  Patient presents with  . Dental Pain   Caroline Hobbs is a 19 y.o. female who presents to the ED complaining of left lower dental pain for two days and left facial swelling since today. Patient reports that 2 days ago she started having left lower dental pain began progressively worse and her boyfriend gave her an antibiotic pill and pain pill 2 days ago. She reports that this morning she woke up with left-sided facial swelling and she rates her pain a 10 out of 10. She denies history of dental problems and she does not have a dentist. Patient reports taking ibuprofen at 3 AM this morning without relief. He denies history of facial abscesses. Patient has occasional smoker. The patient denies fevers, chills, trouble swallowing, sore throat, rashes, changes to her vision, numbness, tingling, weakness, discharge from her mouth, neck stiffness or headache.  (Consider location/radiation/quality/duration/timing/severity/associated sxs/prior Treatment) HPI  Past Medical History  Diagnosis Date  . 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 Acne     Duac gel per Derm, Dr. Nita Sells  . Asthma 2009    No symptoms since 2009. No med use since 2009   History reviewed. No pertinent past surgical history. Family History  Problem Relation Age of Onset  . Asthma Sister   . Stroke Maternal Grandfather   . Cancer Neg Hx   . Diabetes Neg Hx   . Heart disease Neg Hx   . Hyperlipidemia Neg Hx   . Hypertension Neg Hx    History  Substance Use Topics  . Smoking status: Never Smoker   . Smokeless tobacco: Never Used  . Alcohol Use: No   OB History    No data available     Review of Systems  Constitutional: Negative for fever and chills.  HENT: Positive for dental problem and facial swelling. Negative for congestion, ear pain, hearing loss, mouth sores,  rhinorrhea, sinus pressure, sore throat and trouble swallowing.   Eyes: Negative for pain and visual disturbance.  Respiratory: Negative for cough and shortness of breath.   Cardiovascular: Negative for chest pain.  Gastrointestinal: Negative for nausea, vomiting and abdominal pain.  Genitourinary: Negative for dysuria.  Musculoskeletal: Negative for back pain, neck pain and neck stiffness.  Skin: Negative for rash.  Neurological: Negative for dizziness, weakness, light-headedness, numbness and headaches.      Allergies  Review of patient's allergies indicates no known allergies.  Home Medications   Prior to Admission medications   Medication Sig Start Date End Date Taking? Authorizing Provider  dicyclomine (BENTYL) 20 MG tablet Take 1 tablet (20 mg total) by mouth 2 (two) times daily. Patient not taking: Reported on 01/14/2015 08/19/13   Reuben Likes, MD  diphenoxylate-atropine (LOMOTIL) 2.5-0.025 MG per tablet Take 1 tablet by mouth 4 (four) times daily as needed for diarrhea or loose stools. Patient not taking: Reported on 01/14/2015 08/19/13   Reuben Likes, MD  HYDROcodone-acetaminophen (NORCO/VICODIN) 5-325 MG per tablet Take 1 tablet by mouth every 4 (four) hours as needed. 01/14/15   Everlene Farrier, PA-C  ibuprofen (ADVIL,MOTRIN) 800 MG tablet Take 1 tablet (800 mg total) by mouth 3 (three) times daily. Patient not taking: Reported on 01/14/2015 06/03/13   Ruby Cola, PA-C  naproxen (NAPROSYN) 250 MG tablet Take 1 tablet (250  mg total) by mouth 2 (two) times daily with a meal. 01/14/15   Everlene Farrier, PA-C  penicillin v potassium (VEETID) 500 MG tablet Take 1 tablet (500 mg total) by mouth 4 (four) times daily. 01/14/15   Everlene Farrier, PA-C  predniSONE (DELTASONE) 20 MG tablet Take 2 tablets (40 mg total) by mouth daily. 01/14/15   Everlene Farrier, PA-C   BP 117/79 mmHg  Pulse 85  Temp(Src) 98.1 F (36.7 C) (Oral)  Resp 16  SpO2 100% Physical Exam  Constitutional:  She is oriented to person, place, and time. She appears well-developed and well-nourished. No distress.  Nontoxic appearing.  HENT:  Head: Normocephalic and atraumatic.  Right Ear: External ear normal.  Left Ear: External ear normal.  Mouth/Throat: Oropharynx is clear and moist.  Left mandibular facial swelling and induration. No fluctuance or drainage. It does not extend into her neck. No difficulty speaking or handling secretions. No overlying erythema. Left lower molar is tender to touch. No discharge in the mouth. No gross abscess or fluctuance. Soft palate rises symmetrically. Uvula is midline without edema.  Eyes: Conjunctivae and EOM are normal. Pupils are equal, round, and reactive to light. Right eye exhibits no discharge. Left eye exhibits no discharge.  Neck: Normal range of motion. Neck supple. No JVD present. No tracheal deviation present.  Left-sided reactive submandibular lymphadenopathy.  Cardiovascular: Normal rate, regular rhythm, normal heart sounds and intact distal pulses.  Exam reveals no gallop and no friction rub.   No murmur heard. HR is 96  Pulmonary/Chest: Effort normal and breath sounds normal. No respiratory distress. She has no wheezes. She has no rales.  Abdominal: Soft. She exhibits no distension. There is no tenderness.  Musculoskeletal: She exhibits no edema.  Neurological: She is alert and oriented to person, place, and time. No cranial nerve deficit. Coordination normal.  Patient alert and oriented 3. Cranial nerves are intact.   Skin: Skin is warm and dry. No rash noted. She is not diaphoretic. No erythema. No pallor.  Psychiatric: She has a normal mood and affect. Her behavior is normal.  Nursing note and vitals reviewed.   ED Course  Procedures (including critical care time) Labs Review Labs Reviewed - No data to display  Imaging Review No results found.   EKG Interpretation None      Filed Vitals:   01/14/15 1215 01/14/15 1245 01/14/15  1300 01/14/15 1332  BP: 118/57 127/69 111/68 117/79  Pulse: 70 81 80 85  Temp:      TempSrc:      Resp:      SpO2: 100% 100% 100% 100%     MDM   Meds given in ED:  Medications  sodium chloride 0.9 % bolus 1,000 mL (1,000 mLs Intravenous New Bag/Given 01/14/15 1126)  morphine 4 MG/ML injection 4 mg (4 mg Intravenous Given 01/14/15 1127)  ondansetron (ZOFRAN) injection 4 mg (4 mg Intravenous Given 01/14/15 1126)  Ampicillin-Sulbactam (UNASYN) 3 g in sodium chloride 0.9 % 100 mL IVPB (0 g Intravenous Stopped 01/14/15 1258)  dexamethasone (DECADRON) injection 10 mg (10 mg Intravenous Given 01/14/15 1127)    New Prescriptions   HYDROCODONE-ACETAMINOPHEN (NORCO/VICODIN) 5-325 MG PER TABLET    Take 1 tablet by mouth every 4 (four) hours as needed.   NAPROXEN (NAPROSYN) 250 MG TABLET    Take 1 tablet (250 mg total) by mouth 2 (two) times daily with a meal.   PENICILLIN V POTASSIUM (VEETID) 500 MG TABLET    Take 1 tablet (500  mg total) by mouth 4 (four) times daily.   PREDNISONE (DELTASONE) 20 MG TABLET    Take 2 tablets (40 mg total) by mouth daily.    Final diagnoses:  Dental abscess   This  is a 19 y.o. female who presents to the ED complaining of left lower dental pain for two days and left facial swelling since today. Patient reports that 2 days ago she started having left lower dental pain began progressively worse and her boyfriend gave her an antibiotic pill and pain pill 2 days ago. She reports that this morning she woke up with left-sided facial swelling and she rates her pain a 10 out of 10. The patient denies any difficulty swallowing, sore throat for changes to her speech. On exam the patient is afebrile and nontoxic appearing. Patient has left sided mandibular edema and induration does not extend into her neck. There is no area of fluctuance or obvious abscess. Her uvula is midline without edema. Her soft palate rises symmetrically. Normal tongue protrusion. No evidence of ludwigs  angina. Will give patient fluid bolus, pain medicine, and Unasyn and Decadron IV in the emergency department. Patient tolerated this well and reports pain relief. The patient requested to eat and drink in the emergency department. The patient reports she tolerated a soda and graham crackers in the emergency department. The patient reports feeling ready for discharge. Discharge patient and advise close follow-up as needed. She needs to be seen either by a dentist or in the emergency department again within 72 hours at minimum. Patient given dental provider resources. I advised she is to return to the emergency department right away with any new or worsening symptoms or new concerns. We'll discharge the patient with prescriptions for penicillin, prednisone, naproxen and Norco. Narcotic pain medicine precautions provided. I advised the patient to follow-up with a dentist as soon as possible.  I advised the patient to return to the emergency department with new or worsening symptoms or new concerns. The patient verbalized understanding and agreement with plan.    Patient was discussed with and evaluated by Dr. Fayrene Fearing who agrees with assessment and plan.   Everlene Farrier, PA-C 01/14/15 1352  Rolland Porter, MD 01/19/15 [phone]

## 2015-01-14 NOTE — ED Notes (Signed)
I gave the patients visitor a cup of ice, a sprite, and a pack of graham crackers.

## 2015-01-14 NOTE — Discharge Instructions (Signed)
Dental Abscess °A dental abscess is a collection of infected fluid (pus) from a bacterial infection in the inner part of the tooth (pulp). It usually occurs at the end of the tooth's root.  °CAUSES  °· Severe tooth decay. °· Trauma to the tooth that allows bacteria to enter into the pulp, such as a broken or chipped tooth. °SYMPTOMS  °· Severe pain in and around the infected tooth. °· Swelling and redness around the abscessed tooth or in the mouth or face. °· Tenderness. °· Pus drainage. °· Bad breath. °· Bitter taste in the mouth. °· Difficulty swallowing. °· Difficulty opening the mouth. °· Nausea. °· Vomiting. °· Chills. °· Swollen neck glands. °DIAGNOSIS  °· A medical and dental history will be taken. °· An examination will be performed by tapping on the abscessed tooth. °· X-rays may be taken of the tooth to identify the abscess. °TREATMENT °The goal of treatment is to eliminate the infection. You may be prescribed antibiotic medicine to stop the infection from spreading. A root canal may be performed to save the tooth. If the tooth cannot be saved, it may be pulled (extracted) and the abscess may be drained.  °HOME CARE INSTRUCTIONS °· Only take over-the-counter or prescription medicines for pain, fever, or discomfort as directed by your caregiver. °· Rinse your mouth (gargle) often with salt water (¼ tsp salt in 8 oz [250 ml] of warm water) to relieve pain or swelling. °· Do not drive after taking pain medicine (narcotics). °· Do not apply heat to the outside of your face. °· Return to your dentist for further treatment as directed. °SEEK MEDICAL CARE IF: °· Your pain is not helped by medicine. °· Your pain is getting worse instead of better. °SEEK IMMEDIATE MEDICAL CARE IF: °· You have a fever or persistent symptoms for more than 2-3 days. °· You have a fever and your symptoms suddenly get worse. °· You have chills or a very bad headache. °· You have problems breathing or swallowing. °· You have trouble  opening your mouth. °· You have swelling in the neck or around the eye. °Document Released: 07/16/2005 Document Revised: 04/09/2012 Document Reviewed: 10/24/2010 °[employer]® Patient Information ©2015 [employer]. This information is not intended to replace advice given to you by your health care provider. Make sure you discuss any questions you have with your health care provider. ° °Dental Care and Dentist Visits °Dental care supports good overall health. Regular dental visits can also help you avoid dental pain, bleeding, infection, and other more serious health problems in the future. It is important to keep the mouth healthy because diseases in the teeth, gums, and other oral tissues can spread to other areas of the body. Some problems, such as diabetes, heart disease, and pre-term labor have been associated with poor oral health.  °See your dentist every 6 months. If you experience emergency problems such as a toothache or broken tooth, go to the dentist right away. If you see your dentist regularly, you may catch problems early. It is easier to be treated for problems in the early stages.  °WHAT TO EXPECT AT A DENTIST VISIT  °Your dentist will look for many common oral health problems and recommend proper treatment. At your regular dental visit, you can expect: °· Gentle cleaning of the teeth and gums. This includes scraping and polishing. This helps to remove the sticky substance around the teeth and gums (plaque). Plaque forms in the mouth shortly after eating. Over time, plaque hardens   on the teeth as tartar. If tartar is not removed regularly, it can cause problems. Cleaning also helps remove stains. °· Periodic X-rays. These pictures of the teeth and supporting bone will help your dentist assess the health of your teeth. °· Periodic fluoride treatments. Fluoride is a natural mineral shown to help strengthen teeth. Fluoride treatment involves applying a fluoride gel or varnish to the teeth. It is most  commonly done in children. °· Examination of the mouth, tongue, jaws, teeth, and gums to look for any oral health problems, such as: °¨ Cavities (dental caries). This is decay on the tooth caused by plaque, sugar, and acid in the mouth. It is best to catch a cavity when it is small. °¨ Inflammation of the gums caused by plaque buildup (gingivitis). °¨ Problems with the mouth or malformed or misaligned teeth. °¨ Oral cancer or other diseases of the soft tissues or jaws.  °KEEP YOUR TEETH AND GUMS HEALTHY °For healthy teeth and gums, follow these general guidelines as well as your dentist's specific advice: °· Have your teeth professionally cleaned at the dentist every 6 months. °· Brush twice daily with a fluoride toothpaste. °· Floss your teeth daily.  °· Ask your dentist if you need fluoride supplements, treatments, or fluoride toothpaste. °· Eat a healthy diet. Reduce foods and drinks with added sugar. °· Avoid smoking. °TREATMENT FOR ORAL HEALTH PROBLEMS °If you have oral health problems, treatment varies depending on the conditions present in your teeth and gums. °· Your caregiver will most likely recommend good oral hygiene at each visit. °· For cavities, gingivitis, or other oral health disease, your caregiver will perform a procedure to treat the problem. This is typically done at a separate appointment. Sometimes your caregiver will refer you to another dental specialist for specific tooth problems or for surgery. °SEEK IMMEDIATE DENTAL CARE IF: °· You have pain, bleeding, or soreness in the gum, tooth, jaw, or mouth area. °· A permanent tooth becomes loose or separated from the gum socket. °· You experience a blow or injury to the mouth or jaw area. °Document Released: 03/28/2011 Document Revised: 10/08/2011 Document Reviewed: 03/28/2011 °[employer]® Patient Information ©2015 [employer]. This information is not intended to replace advice given to you by your health care provider. Make sure you discuss any  questions you have with your health care provider. ° °

## 2015-01-14 NOTE — ED Provider Notes (Signed)
Patient seen and evaluated. Impressive left lower periodontal and facial swelling. On exam including bimanual of the cheek there is no fluctuance or localized induration to suggest abscess. Teeth otherwise appear in good repair. Swelling does not extend below the ramus of the mandible and into the neck. No difficulty with swallowing or speech. Actually, the patient requested E while she is here. Recommendation is IV sterilely, pain medicines, and antiemetics. Her observation period with continued treatment for 48-72 hours. Given careful return precautions including any sensation of worsening. Any sensation to culture breathing swallowing or extension of the neck. She expresses understanding. Anxious appropriate for initial outpatient observation with continued treatment time.  Rolland Porter, MD 01/14/15 [phone]

## 2015-02-10 ENCOUNTER — Emergency Department (HOSPITAL_COMMUNITY)
Admission: EM | Admit: 2015-02-10 | Discharge: 2015-02-10 | Disposition: A | Discharge status: Home / Self Care | Payer: PRIVATE HEALTH INSURANCE | Attending: Emergency Medicine | Admitting: Emergency Medicine

## 2015-02-10 ENCOUNTER — Encounter (HOSPITAL_COMMUNITY): Payer: Self-pay

## 2015-02-10 DIAGNOSIS — K047 Periapical abscess without sinus: Secondary | ICD-10-CM | POA: Diagnosis not present

## 2015-02-10 DIAGNOSIS — K0381 Cracked tooth: Secondary | ICD-10-CM | POA: Insufficient documentation

## 2015-02-10 DIAGNOSIS — K029 Dental caries, unspecified: Secondary | ICD-10-CM | POA: Diagnosis not present

## 2015-02-10 DIAGNOSIS — K088 Other specified disorders of teeth and supporting structures: Secondary | ICD-10-CM | POA: Diagnosis present

## 2015-02-10 DIAGNOSIS — Z792 Long term (current) use of antibiotics: Secondary | ICD-10-CM | POA: Insufficient documentation

## 2015-02-10 DIAGNOSIS — Z872 Personal history of diseases of the skin and subcutaneous tissue: Secondary | ICD-10-CM | POA: Diagnosis not present

## 2015-02-10 DIAGNOSIS — J45909 Unspecified asthma, uncomplicated: Secondary | ICD-10-CM | POA: Diagnosis not present

## 2015-02-10 DIAGNOSIS — Z7952 Long term (current) use of systemic steroids: Secondary | ICD-10-CM | POA: Insufficient documentation

## 2015-02-10 MED ORDER — HYDROCODONE-ACETAMINOPHEN 5-325 MG PO TABS
1.0000 | ORAL_TABLET | Freq: Four times a day (QID) | ORAL | Status: DC | PRN
Start: 2015-02-10 — End: 2015-05-27

## 2015-02-10 MED ORDER — HYDROCODONE-ACETAMINOPHEN 5-325 MG PO TABS
1.0000 | ORAL_TABLET | Freq: Once | ORAL | Status: AC
  Administered 2015-02-10: 1 via ORAL
  Filled 2015-02-10: qty 1

## 2015-02-10 MED ORDER — NAPROXEN 500 MG PO TABS: 500.0000 mg | ORAL_TABLET | Freq: Two times a day (BID) | ORAL | Status: DC | PRN

## 2015-02-10 MED ORDER — DOXYCYCLINE HYCLATE 100 MG PO CAPS: 100.0000 mg | ORAL_CAPSULE | Freq: Two times a day (BID) | ORAL | Status: DC

## 2015-02-10 NOTE — ED Notes (Signed)
Pt treated x 3 weeks ago for dental abscess. Completed antibiotics.  Yesterday, swelling and pain returned.  No fever.  Pain with chewing.

## 2015-02-10 NOTE — Discharge Instructions (Signed)
Apply warm compresses to jaw throughout the day. Take antibiotic until finished, and avoid direct sunlight while taking this antibiotic. Take naprosyn and norco as directed, as needed for pain but do not drive or operate machinery with pain medication use. Followup with a dentist is very important for ongoing evaluation and management of recurrent dental pain. Call the oral surgeon listed above today or tomorrow to schedule an appointment. Return to emergency department for emergent changing or worsening symptoms.   Dental Abscess A dental abscess is a collection of infected fluid (pus) from a bacterial infection in the inner part of the tooth (pulp). It usually occurs at the end of the tooth's root.  CAUSES   Severe tooth decay.  Trauma to the tooth that allows bacteria to enter into the pulp, such as a broken or chipped tooth. SYMPTOMS   Severe pain in and around the infected tooth.  Swelling and redness around the abscessed tooth or in the mouth or face.  Tenderness.  Pus drainage.  Bad breath.  Bitter taste in the mouth.  Difficulty swallowing.  Difficulty opening the mouth.  Nausea.  Vomiting.  Chills.  Swollen neck glands. DIAGNOSIS   A medical and dental history will be taken.  An examination will be performed by tapping on the abscessed tooth.  X-rays may be taken of the tooth to identify the abscess. TREATMENT The goal of treatment is to eliminate the infection. You may be prescribed antibiotic medicine to stop the infection from spreading. A root canal may be performed to save the tooth. If the tooth cannot be saved, it may be pulled (extracted) and the abscess may be drained.  HOME CARE INSTRUCTIONS  Only take over-the-counter or prescription medicines for pain, fever, or discomfort as directed by your caregiver.  Rinse your mouth (gargle) often with salt water ( tsp salt in 8 oz [250 ml] of warm water) to relieve pain or swelling.  Do not drive after  taking pain medicine (narcotics).  Do not apply heat to the outside of your face.  Return to your dentist for further treatment as directed. SEEK MEDICAL CARE IF:  Your pain is not helped by medicine.  Your pain is getting worse instead of better. SEEK IMMEDIATE MEDICAL CARE IF:  You have a fever or persistent symptoms for more than 2-3 days.  You have a fever and your symptoms suddenly get worse.  You have chills or a very bad headache.  You have problems breathing or swallowing.  You have trouble opening your mouth.  You have swelling in the neck or around the eye. Document Released: 07/16/2005 Document Revised: 04/09/2012 Document Reviewed: 10/24/2010 [facility] Patient Information 2015 [place], [place]. This information is not intended to replace advice given to you by your health care provider. Make sure you discuss any questions you have with your health care provider.  Dental Caries Dental caries is tooth decay. This decay can cause a hole in teeth (cavity) that can get bigger and deeper over time. HOME CARE  Brush and floss your teeth. Do this at least two times a day.  Use a fluoride toothpaste.  Use a mouth rinse if told by your dentist or doctor.  Eat less sugary and starchy foods. Drink less sugary drinks.  Avoid snacking often on sugary and starchy foods. Avoid sipping often on sugary drinks.  Keep regular checkups and cleanings with your dentist.  Use fluoride supplements if told by your dentist or doctor.  Allow fluoride to be applied to  teeth if told by your dentist or doctor. Document Released: 04/24/2008 Document Revised: 11/30/2013 Document Reviewed: 07/18/2012 [facility] Patient Information 2015 [place], [place]. This information is not intended to replace advice given to you by your health care provider. Make sure you discuss any questions you have with your health care provider.   Emergency Department Resource Guide 1) Find a Doctor and Pay Out of  Pocket Although you won't have to find out who is covered by your insurance plan, it is a good idea to ask around and get recommendations. You will then need to call the office and see if the doctor you have chosen will accept you as a new patient and what types of options they offer for patients who are self-pay. Some doctors offer discounts or will set up payment plans for their patients who do not have insurance, but you will need to ask so you aren't surprised when you get to your appointment.  2) Contact Your Local Health Department Not all health departments have doctors that can see patients for sick visits, but many do, so it is worth a call to see if yours does. If you don't know where your local health department is, you can check in your phone book. The CDC also has a tool to help you locate your state's health department, and many state websites also have listings of all of their local health departments.  3) Find a Walk-in Clinic If your illness is not likely to be very severe or complicated, you may want to try a walk in clinic. These are popping up all over the country in pharmacies, drugstores, and shopping centers. They're usually staffed by nurse practitioners or physician assistants that have been trained to treat common illnesses and complaints. They're usually fairly quick and inexpensive. However, if you have serious medical issues or chronic medical problems, these are probably not your best option.  No Primary Care Doctor: - Call Health Connect at  [phone] - they can help you locate a primary care doctor that  accepts your insurance, provides certain services, etc. - Physician Referral Service- [phone]  Chronic Pain Problems: Organization         Address  Phone   Notes  [facility]  [phone] Patients need to be referred by their primary care doctor.   Medication Assistance: Organization         Address  Phone   Notes  [facility]  Medication [facility] [address] [place] [place], [place] [address] [phone] --Must be a resident of [facility] -- Must have NO insurance coverage whatsoever (no Medicaid/ Medicare, etc.) -- The pt. MUST have a primary care doctor that directs their care regularly and follows them in the community   [facility]  [phone]   [place]  [phone]     Dental Care: Organization         Address  Phone  Notes  [facility] Department of [facility] [address] [place], [place] [phone] Accepts children up to age 20 who are enrolled in [place] or [place] Health Choice; pregnant women with a Medicaid card; and children who have applied for Medicaid or [place] Health Choice, but were declined, whose parents can pay a reduced fee at time of service.  [facility] Department of [facility]  [address], [place] [phone] Accepts children up to age 55 who are enrolled in [place] or  [place] Health Choice; pregnant women with a Medicaid card; and children who have applied for Medicaid or [place] Health Choice, but were declined, whose parents can pay a reduced fee at time of service.  [facility]  [address] [place], [place] [phone] Patients are seen by appointment only. Walk-ins are not accepted. [facility] will see patients 15 years of age and older. Monday - Tuesday (8am-5pm) Most Wednesdays (8:30-5pm) $30 per visit, cash only  [facility]  [address], [facility] [phone] Patients are seen by appointment only. Walk-ins are not accepted. [facility] will see patients 20 years of age and older. One Wednesday Evening (Monthly: Volunteer Based).  $30 per visit, cash only  [employer]  [phone] for adults; Children under age 54, call Graduate Pediatric Dentistry at [phone]. Children aged 40-14, please call  [phone] to request a pediatric application.  Dental services are provided in all areas of dental care including fillings, crowns and bridges, complete and partial dentures, implants, gum treatment, root canals, and extractions. Preventive care is also provided. Treatment is provided to both adults and children. Patients are selected via a lottery and there is often a waiting list.   [facility] [address] [place]  [phone] [email or web address]   [facility] [address] [place], [place] [phone], Ext. [phone] Second and Fourth Thursday of each month, opens at 6:30 AM; Clinic ends at 9 AM.  Patients are seen on a first-come first-served basis, and a limited number are seen during each clinic.   [facility]  [address] [place], [place] [phone]   Eligibility Requirements You must have lived in [place], [place], or [place] counties for at least the last three months.   You cannot be eligible for state or federal sponsored [place], including [employer], [place], or [employer].   You generally cannot be eligible for healthcare insurance through your employer.    How to apply: Eligibility screenings are held every Tuesday and Wednesday afternoon from 1:00 pm until 4:00 pm. You do not need an appointment for the interview!  [facility] [address] [place], [place] [phone]   [facility] Health Department  [phone]   [facility] Health Department  [phone]   [facility] Health Department  [phone]

## 2015-02-10 NOTE — ED Provider Notes (Signed)
CSN: [id number]     Arrival date & time 02/10/15  1457 History   This chart was scribed for [employer], PA-C working with Eber Hong, MD by Elveria Rising, ED Scribe. This patient was seen in room WTR1/WLPT1 and the patient's care was started at 3:35 PM.   Chief Complaint  Patient presents with  . Facial Swelling  . Dental Pain   Patient is a 19 y.o. female presenting with tooth pain. The history is provided by the patient. No language interpreter was used.  Dental Pain Location:  Lower Lower teeth location:  18/LL 2nd molar Quality:  Throbbing and constant Severity:  Moderate Onset quality:  Gradual Duration:  1 day Timing:  Constant Progression:  Unchanged Chronicity:  Recurrent Context: abscess and dental caries   Context: not dental fracture   Relieved by:  NSAIDs Worsened by:  Touching and pressure Associated symptoms: facial swelling   Associated symptoms: no difficulty swallowing, no drooling, no fever, no gum swelling, no neck pain, no neck swelling, no oral bleeding and no trismus   Risk factors: lack of dental care   Risk factors: no diabetes, no immunosuppression and no smoking    HPI Comments: Caroline Hobbs is a 19 y.o. female who presents to the Emergency Department complaining of recurrent left lower jaw swelling and dental pain onset upon wakening this morning. Patient describes 9/10, constant, throbbing, nonradiating pain at the L lower molar, exacerbated with chewing and applied pressure. Patient reports attempted treatment with ibuprofen, with mild relief of her pain. Chart history reveals an evaluation at [facility] ED for same symptoms one month ago, 6/17. Patient discharged with Norco, Naproxen, penicillin and prednisone. Patient reports improvement with this treatment and then return of her symptoms yesterday. Patient denies fever, chills, cough, shortness of breath, chest pain, abdominal pain, nausea, vomiting, diarrhea,  urinary symptoms, neck  pain/swelling, numbness, tingling, weakness, drooling, trouble swallowing, sore throat, ear pain or drainage, trismus, rhinorrhea, gingival swelling or bleeding. Patient does not have a dentist. Pt states she's a nonsmoker. No hx of DM or immunosuppressant conditions/medications.   Past Medical History  Diagnosis Date  . 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 Acne     Duac gel per Derm, Dr. Nita Sells  . Asthma 2009    No symptoms since 2009. No med use since 2009   History reviewed. No pertinent past surgical history. Family History  Problem Relation Age of Onset  . Asthma Sister   . Stroke Maternal Grandfather   . Cancer Neg Hx   . Diabetes Neg Hx   . Heart disease Neg Hx   . Hyperlipidemia Neg Hx   . Hypertension Neg Hx    History  Substance Use Topics  . Smoking status: Never Smoker   . Smokeless tobacco: Never Used  . Alcohol Use: No   OB History    No data available     Review of Systems  Constitutional: Negative for fever and chills.  HENT: Positive for dental problem and facial swelling. Negative for drooling, ear discharge, ear pain, rhinorrhea, sore throat and trouble swallowing.   Eyes: Negative for visual disturbance.  Respiratory: Negative for shortness of breath.   Cardiovascular: Negative for chest pain.  Gastrointestinal: Negative for nausea, vomiting, abdominal pain and diarrhea.  Genitourinary: Negative for dysuria and hematuria.  Musculoskeletal: Negative for myalgias, arthralgias and neck pain.  Skin: Negative for color change.  Allergic/Immunologic: Negative for immunocompromised state.  Neurological: Negative for  weakness and numbness.  Psychiatric/Behavioral: Negative for confusion.  10 Systems reviewed and are negative for acute change except as noted in the HPI.     Allergies  Review of patient's allergies indicates no known allergies.  Home Medications   Prior to Admission medications   Medication Sig Start Date End Date Taking?  Authorizing Provider  dicyclomine (BENTYL) 20 MG tablet Take 1 tablet (20 mg total) by mouth 2 (two) times daily. Patient not taking: Reported on 01/14/2015 08/19/13   Reuben Likesavid C Keller, MD  diphenoxylate-atropine (LOMOTIL) 2.5-0.025 MG per tablet Take 1 tablet by mouth 4 (four) times daily as needed for diarrhea or loose stools. Patient not taking: Reported on 01/14/2015 08/19/13   Reuben Likesavid C Keller, MD  HYDROcodone-acetaminophen (NORCO/VICODIN) 5-325 MG per tablet Take 1 tablet by mouth every 4 (four) hours as needed. 01/14/15   Everlene FarrierWilliam Dansie, PA-C  ibuprofen (ADVIL,MOTRIN) 800 MG tablet Take 1 tablet (800 mg total) by mouth 3 (three) times daily. Patient not taking: Reported on 01/14/2015 06/03/13   Ruby Colaatherine Schinlever, PA-C  naproxen (NAPROSYN) 250 MG tablet Take 1 tablet (250 mg total) by mouth 2 (two) times daily with a meal. 01/14/15   Everlene FarrierWilliam Dansie, PA-C  penicillin v potassium (VEETID) 500 MG tablet Take 1 tablet (500 mg total) by mouth 4 (four) times daily. 01/14/15   Everlene FarrierWilliam Dansie, PA-C  predniSONE (DELTASONE) 20 MG tablet Take 2 tablets (40 mg total) by mouth daily. 01/14/15   Everlene FarrierWilliam Dansie, PA-C   Triage Vitals: BP 125/74 mmHg  Pulse 88  Temp(Src) 98.4 F (36.9 C) (Oral)  Resp 20  SpO2 100%  LMP 01/27/2015 Physical Exam  Constitutional: She is oriented to person, place, and time. Vital signs are normal. She appears well-developed and well-nourished.  Non-toxic appearance. No distress.  Afebrile, nontoxic, NAD  HENT:  Head: Normocephalic and atraumatic.  Nose: Nose normal.  Mouth/Throat: Uvula is midline, oropharynx is clear and moist and mucous membranes are normal. No trismus in the jaw. Dental abscesses and dental caries present. No uvula swelling.    L lower molar #18 with TTP and surrounding gingival swelling with indurated abscess adjacent to this tooth, no drainage, no fluctuance. L sided facial swelling without overlying skin changes, without obscuring angle of jaw. Subfloor of  mouth without induration or tenderness. No drooling. Oropharynx clear and moist, without uvular swelling or deviation, no trismus or drooling, no tonsillar swelling or erythema, no exudates. Nose clear.    Eyes: Conjunctivae and EOM are normal. Right eye exhibits no discharge. Left eye exhibits no discharge.  Neck: Normal range of motion and phonation normal. Neck supple.  No stridor or muffled phonation  Cardiovascular: Normal rate.   Pulmonary/Chest: Effort normal. No respiratory distress.  Abdominal: Normal appearance. She exhibits no distension.  Musculoskeletal: Normal range of motion.  Lymphadenopathy:       Head (left side): Submandibular adenopathy present.  L submandibular reactive LAD which is mildly TTP  Neurological: She is alert and oriented to person, place, and time. She has normal strength. No sensory deficit.  Skin: Skin is warm, dry and intact. No rash noted.  Psychiatric: She has a normal mood and affect. Her behavior is normal.  Nursing note and vitals reviewed.   ED Course  Procedures (including critical care time)  COORDINATION OF CARE: 3:44 PM- Discussed treatment plan with patient at bedside and patient agreed to plan.   Labs Review Labs Reviewed - No data to display  Imaging Review No results found.  EKG Interpretation None      MDM   Final diagnoses:  Dental abscess  Pain due to dental caries    19 y.o. female here with Dental pain associated with dental infection and dental abscess with patient afebrile, non toxic appearing and swallowing secretions well. No concern for ludwig's, airway patent. Abscess is indurated without fluctuance, doubt need for I&D today, facial swelling does not obscure angle of jaw, and no trismus associated with it. I gave patient referral to dentist and stressed the importance of dental follow up for ultimate management of dental pain. Discussed that she must call in the next 24-48hrs to get ongoing management of this  tooth that has caused recurrent abscesses. I have also discussed reasons to return immediately to the ER.  Patient expresses understanding and agrees with plan.  I will also give doxycycline and pain control.    I personally performed the services described in this documentation, which was scribed in my presence. The recorded information has been reviewed and is accurate.  BP 125/74 mmHg  Pulse 88  Temp(Src) 98.4 F (36.9 C) (Oral)  Resp 20  SpO2 100%  LMP 01/27/2015  Meds ordered this encounter  Medications  . HYDROcodone-acetaminophen (NORCO/VICODIN) 5-325 MG per tablet 1 tablet    Sig:   . naproxen (NAPROSYN) 500 MG tablet    Sig: Take 1 tablet (500 mg total) by mouth 2 (two) times daily as needed for mild pain, moderate pain or headache (TAKE WITH MEALS.).    Dispense:  20 tablet    Refill:  0    Order Specific Question:  Supervising Provider    Answer:  MILLER, BRIAN [[id number]]  . HYDROcodone-acetaminophen (NORCO) 5-325 MG per tablet    Sig: Take 1 tablet by mouth every 6 (six) hours as needed for severe pain.    Dispense:  10 tablet    Refill:  0    Order Specific Question:  Supervising Provider    Answer:  Hyacinth Meeker, BRIAN [[id number]]  . doxycycline (VIBRAMYCIN) 100 MG capsule    Sig: Take 1 capsule (100 mg total) by mouth 2 (two) times daily. One po bid x 7 days    Dispense:  14 capsule    Refill:  0    Order Specific Question:  Supervising Provider    Answer:  Eber Hong [[id number]]      Jerriah Ines Camprubi-Soms, PA-C 02/10/15 1555  Eber Hong, MD 02/11/15 0003

## 2015-05-27 ENCOUNTER — Encounter (HOSPITAL_COMMUNITY): Payer: Self-pay | Admitting: Emergency Medicine

## 2015-05-27 ENCOUNTER — Emergency Department (HOSPITAL_COMMUNITY)
Admission: EM | Admit: 2015-05-27 | Discharge: 2015-05-27 | Disposition: A | Discharge status: Home / Self Care | Payer: PRIVATE HEALTH INSURANCE | Attending: Emergency Medicine | Admitting: Emergency Medicine

## 2015-05-27 DIAGNOSIS — J45909 Unspecified asthma, uncomplicated: Secondary | ICD-10-CM | POA: Insufficient documentation

## 2015-05-27 DIAGNOSIS — K047 Periapical abscess without sinus: Secondary | ICD-10-CM | POA: Insufficient documentation

## 2015-05-27 DIAGNOSIS — Z792 Long term (current) use of antibiotics: Secondary | ICD-10-CM | POA: Insufficient documentation

## 2015-05-27 DIAGNOSIS — Z872 Personal history of diseases of the skin and subcutaneous tissue: Secondary | ICD-10-CM | POA: Insufficient documentation

## 2015-05-27 MED ORDER — HYDROCODONE-ACETAMINOPHEN 5-325 MG PO TABS: 2.0000 | ORAL_TABLET | ORAL | Status: DC | PRN

## 2015-05-27 MED ORDER — METRONIDAZOLE 500 MG PO TABS: 500.0000 mg | ORAL_TABLET | Freq: Two times a day (BID) | ORAL | Status: DC

## 2015-05-27 MED ORDER — AMOXICILLIN 500 MG PO CAPS: 500.0000 mg | ORAL_CAPSULE | Freq: Three times a day (TID) | ORAL | Status: DC

## 2015-05-27 MED ORDER — OXYCODONE-ACETAMINOPHEN 5-325 MG PO TABS
1.0000 | ORAL_TABLET | Freq: Once | ORAL | Status: AC
Start: 2015-05-27 — End: 2015-05-27
  Administered 2015-05-27: 1 via ORAL
  Filled 2015-05-27: qty 1

## 2015-05-27 MED ORDER — CEFTRIAXONE SODIUM 1 G IJ SOLR
1.0000 g | Freq: Once | INTRAMUSCULAR | Status: AC
  Administered 2015-05-27: 1 g via INTRAMUSCULAR
  Filled 2015-05-27: qty 10

## 2015-05-27 NOTE — ED Provider Notes (Signed)
CSN: [id number]     Arrival date & time 05/27/15  [id number] History   First MD Initiated Contact with Patient 05/27/15 [phone]     No chief complaint on file.    (Consider location/radiation/quality/duration/timing/severity/associated sxs/prior Treatment) HPI Comments: Presents to the emergency department for evaluation of facial pain. Patient reports that she has had a toothache for some time, has been treated for the toothache, but has not been able to get to the dentist because she does not have insurance. Pain has worsened now and the left side of her jaw is very swollen. No trouble swallowing or breathing.   Past Medical History  Diagnosis Date  . 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 Acne     Duac gel per Derm, Dr. Nita SellsJohn Hall  . Asthma 2009    No symptoms since 2009. No med use since 2009   No past surgical history on file. Family History  Problem Relation Age of Onset  . Asthma Sister   . Stroke Maternal Grandfather   . Cancer Neg Hx   . Diabetes Neg Hx   . Heart disease Neg Hx   . Hyperlipidemia Neg Hx   . Hypertension Neg Hx    Social History  Substance Use Topics  . Smoking status: Never Smoker   . Smokeless tobacco: Never Used  . Alcohol Use: No   OB History    No data available     Review of Systems  HENT: Positive for dental problem and facial swelling.   All other systems reviewed and are negative.     Allergies  Review of patient's allergies indicates no known allergies.  Home Medications   Prior to Admission medications   Medication Sig Start Date End Date Taking? Authorizing Provider  dicyclomine (BENTYL) 20 MG tablet Take 1 tablet (20 mg total) by mouth 2 (two) times daily. Patient not taking: Reported on 01/14/2015 08/19/13   Reuben Likesavid C Keller, MD  diphenoxylate-atropine (LOMOTIL) 2.5-0.025 MG per tablet Take 1 tablet by mouth 4 (four) times daily as needed for diarrhea or loose stools. Patient not taking: Reported on 01/14/2015 08/19/13   Reuben Likesavid  C Keller, MD  doxycycline (VIBRAMYCIN) 100 MG capsule Take 1 capsule (100 mg total) by mouth 2 (two) times daily. One po bid x 7 days 02/10/15   Mercedes Camprubi-Soms, PA-C  HYDROcodone-acetaminophen (NORCO) 5-325 MG per tablet Take 1 tablet by mouth every 6 (six) hours as needed for severe pain. 02/10/15   Mercedes Camprubi-Soms, PA-C  HYDROcodone-acetaminophen (NORCO/VICODIN) 5-325 MG per tablet Take 1 tablet by mouth every 4 (four) hours as needed. Patient not taking: Reported on 02/10/2015 01/14/15   Everlene FarrierWilliam Dansie, PA-C  ibuprofen (ADVIL,MOTRIN) 200 MG tablet Take 400 mg by mouth every 6 (six) hours as needed for fever, headache, mild pain or moderate pain.    Historical Provider, MD  ibuprofen (ADVIL,MOTRIN) 800 MG tablet Take 1 tablet (800 mg total) by mouth 3 (three) times daily. Patient not taking: Reported on 01/14/2015 06/03/13   Ruby Colaatherine Schinlever, PA-C  naproxen (NAPROSYN) 250 MG tablet Take 1 tablet (250 mg total) by mouth 2 (two) times daily with a meal. Patient not taking: Reported on 02/10/2015 01/14/15   Everlene FarrierWilliam Dansie, PA-C  naproxen (NAPROSYN) 500 MG tablet Take 1 tablet (500 mg total) by mouth 2 (two) times daily as needed for mild pain, moderate pain or headache (TAKE WITH MEALS.). 02/10/15   Mercedes Camprubi-Soms, PA-C  penicillin v potassium (VEETID) 500 MG tablet  Take 1 tablet (500 mg total) by mouth 4 (four) times daily. Patient not taking: Reported on 02/10/2015 01/14/15   Everlene Farrier, PA-C  predniSONE (DELTASONE) 20 MG tablet Take 2 tablets (40 mg total) by mouth daily. Patient not taking: Reported on 02/10/2015 01/14/15   Everlene Farrier, PA-C   There were no vitals taken for this visit. Physical Exam  Constitutional: She is oriented to person, place, and time. She appears well-developed and well-nourished. No distress.  HENT:  Head: Normocephalic and atraumatic.  Right Ear: Hearing normal.  Left Ear: Hearing normal.  Nose: Nose normal.  Mouth/Throat: Oropharynx is clear  and moist and mucous membranes are normal. Dental abscesses present.    Eyes: Conjunctivae and EOM are normal. Pupils are equal, round, and reactive to light.  Neck: Normal range of motion. Neck supple.  Cardiovascular: Regular rhythm, S1 normal and S2 normal.  Exam reveals no gallop and no friction rub.   No murmur heard. Pulmonary/Chest: Effort normal and breath sounds normal. No respiratory distress. She exhibits no tenderness.  Abdominal: Soft. Normal appearance and bowel sounds are normal. There is no hepatosplenomegaly. There is no tenderness. There is no rebound, no guarding, no tenderness at McBurney's point and negative Murphy's sign. No hernia.  Musculoskeletal: Normal range of motion.  Neurological: She is alert and oriented to person, place, and time. She has normal strength. No cranial nerve deficit or sensory deficit. Coordination normal. GCS eye subscore is 4. GCS verbal subscore is 5. GCS motor subscore is 6.  Skin: Skin is warm, dry and intact. No rash noted. No cyanosis.  Psychiatric: She has a normal mood and affect. Her speech is normal and behavior is normal. Thought content normal.  Nursing note and vitals reviewed.   ED Course  Procedures (including critical care time) Labs Review Labs Reviewed - No data to display  Imaging Review No results found. I have personally reviewed and evaluated these images and lab results as part of my medical decision-making.   EKG Interpretation None      MDM   Final diagnoses:  None   dental abscess  Presents to the emergency department for evaluation of dental pain and facial swelling. Patient has obvious dental abscess. There is no drainable fluctuance identified on examination, however. I did discuss treatment options with the patient. I told her that I felt the best antibiotic for her would be clindamycin, but she is not going to be able to pay for this. Patient therefore will be given a shot of Rocephin here in the ER and  will be initiated on amoxicillin.    Gilda Crease, MD 05/27/15 [phone]

## 2015-05-27 NOTE — Discharge Instructions (Signed)

## 2015-05-27 NOTE — ED Notes (Signed)
Pt remains monitored by blood pressure and pulse ox.  

## 2015-05-27 NOTE — ED Notes (Signed)
Pt arrives from home with R sided facial swelling and dental pain.  Pt reports difficulty swallowing,  But able to manage secretions.  Pt reports pain began yesterday, swelling started today. Resp e/u.

## 2015-06-18 ENCOUNTER — Emergency Department (HOSPITAL_COMMUNITY)
Admission: EM | Admit: 2015-06-18 | Discharge: 2015-06-18 | Disposition: A | Discharge status: Home / Self Care | Payer: PRIVATE HEALTH INSURANCE | Attending: Emergency Medicine | Admitting: Emergency Medicine

## 2015-06-18 ENCOUNTER — Encounter (HOSPITAL_COMMUNITY): Payer: Self-pay | Admitting: Nurse Practitioner

## 2015-06-18 DIAGNOSIS — Z872 Personal history of diseases of the skin and subcutaneous tissue: Secondary | ICD-10-CM | POA: Insufficient documentation

## 2015-06-18 DIAGNOSIS — J45909 Unspecified asthma, uncomplicated: Secondary | ICD-10-CM | POA: Insufficient documentation

## 2015-06-18 DIAGNOSIS — K047 Periapical abscess without sinus: Secondary | ICD-10-CM | POA: Diagnosis not present

## 2015-06-18 DIAGNOSIS — Z792 Long term (current) use of antibiotics: Secondary | ICD-10-CM | POA: Diagnosis not present

## 2015-06-18 DIAGNOSIS — K0889 Other specified disorders of teeth and supporting structures: Secondary | ICD-10-CM | POA: Diagnosis present

## 2015-06-18 MED ORDER — CLINDAMYCIN HCL 150 MG PO CAPS: 150.0000 mg | ORAL_CAPSULE | Freq: Three times a day (TID) | ORAL | Status: DC

## 2015-06-18 NOTE — Discharge Instructions (Signed)

## 2015-06-18 NOTE — ED Notes (Signed)
Pt placed in room awaiting provider. Pt asked if this writer knew how long it would be because " I kinda have somewhere to be". Nurse notified.

## 2015-06-18 NOTE — ED Notes (Addendum)
She reports onset L lower dental pain and swelling onset this am. She has upcoming dental appt this week but was concerned about infection and pain

## 2015-06-18 NOTE — ED Provider Notes (Signed)
CSN: [id number]     Arrival date & time 06/18/15  1621 History  By signing my name below, I, Ronney LionSuzanne Le, attest that this documentation has been prepared under the direction and in the presence of Teressa LowerVrinda Marius Betts, NP. Electronically Signed: Ronney LionSuzanne Le, ED Scribe. 06/18/2015. 4:48 PM.    Chief Complaint  Patient presents with  . Dental Pain   The history is provided by the patient. No language interpreter was used.    HPI Comments: Caroline Hobbs is a 19 y.o. female who presents to the Emergency Department complaining of recurrent, constant, left lower dental pain and swelling that began today. She suspects this is a dental abscess as she has been diagnosed with a dental abscess 3 times before in the same area. She states she has a dental appointment scheduled in 4 days. She denies fever. Patient has NKDA to antibiotics. She states her LMP began 05/21/15.   Past Medical History  Diagnosis Date  . 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 Acne     Duac gel per Derm, Dr. Nita SellsJohn Hall  . Asthma 2009    No symptoms since 2009. No med use since 2009   History reviewed. No pertinent past surgical history. Family History  Problem Relation Age of Onset  . Asthma Sister   . Stroke Maternal Grandfather   . Cancer Neg Hx   . Diabetes Neg Hx   . Heart disease Neg Hx   . Hyperlipidemia Neg Hx   . Hypertension Neg Hx    Social History  Substance Use Topics  . Smoking status: Never Smoker   . Smokeless tobacco: Never Used  . Alcohol Use: No   OB History    No data available     Review of Systems  Constitutional: Negative for fever.  HENT: Positive for dental problem.   All other systems reviewed and are negative.  Allergies  Review of patient's allergies indicates no known allergies.  Home Medications   Prior to Admission medications   Medication Sig Start Date End Date Taking? Authorizing Provider  amoxicillin (AMOXIL) 500 MG capsule Take 1 capsule (500 mg total) by  mouth 3 (three) times daily. 05/27/15   Gilda Creasehristopher J Pollina, MD  HYDROcodone-acetaminophen (NORCO/VICODIN) 5-325 MG tablet Take 2 tablets by mouth every 4 (four) hours as needed for moderate pain. 05/27/15   Gilda Creasehristopher J Pollina, MD  ibuprofen (ADVIL,MOTRIN) 200 MG tablet Take 400 mg by mouth every 6 (six) hours as needed for fever, headache, mild pain or moderate pain.    Historical Provider, MD  metroNIDAZOLE (FLAGYL) 500 MG tablet Take 1 tablet (500 mg total) by mouth 2 (two) times daily. One po bid x 7 days 05/27/15   Gilda Creasehristopher J Pollina, MD   BP 153/107 mmHg  Pulse 120  Temp(Src) 98.4 F (36.9 C) (Oral)  Resp 18  SpO2 99%  LMP 05/21/2015 (Exact Date) Physical Exam  Constitutional: She is oriented to person, place, and time. She appears well-developed and well-nourished. No distress.  HENT:  Head: Normocephalic and atraumatic.  Right Ear: External ear normal.  Left Ear: External ear normal.  Obvious swelling noted to the left lower jaw. No problems swallowing or breathing  Eyes: Conjunctivae and EOM are normal.  Neck: Neck supple. No tracheal deviation present.  Cardiovascular: Normal rate.   Pulmonary/Chest: Effort normal. No respiratory distress.  Musculoskeletal: Normal range of motion.  Neurological: She is alert and oriented to person, place, and time.  Skin:  Skin is warm and dry.  Psychiatric: She has a normal mood and affect. Her behavior is normal.  Nursing note and vitals reviewed.   ED Course  Procedures (including critical care time)  DIAGNOSTIC STUDIES: Oxygen Saturation is 99% on RA, normal by my interpretation.    COORDINATION OF CARE: 4:44 PM - Discussed treatment plan with pt at bedside which includes Rx antibiotics and ibuprofen prn for pain. Pt verbalized understanding and agreed to plan.   MDM   Final diagnoses:  Dental abscess   Pt has appointment with dentist on Wednesday. No sign of ludwigs angina. Will have her take clindamycin  I  personally performed the services described in this documentation, which was scribed in my presence. The recorded information has been reviewed and is accurate.     Teressa Lower, NP 06/18/15 1654  Doug Sou, MD 06/19/15 1610

## 2015-08-23 ENCOUNTER — Ambulatory Visit: Payer: Self-pay | Admitting: Obstetrics

## 2015-11-18 ENCOUNTER — Ambulatory Visit: Payer: Self-pay | Admitting: Certified Nurse Midwife

## 2016-10-11 ENCOUNTER — Ambulatory Visit: Payer: PRIVATE HEALTH INSURANCE | Admitting: Obstetrics

## 2016-11-14 DIAGNOSIS — Z113 Encounter for screening for infections with a predominantly sexual mode of transmission: Secondary | ICD-10-CM | POA: Diagnosis not present

## 2016-11-14 DIAGNOSIS — Z01419 Encounter for gynecological examination (general) (routine) without abnormal findings: Secondary | ICD-10-CM | POA: Diagnosis not present

## 2016-11-14 DIAGNOSIS — N926 Irregular menstruation, unspecified: Secondary | ICD-10-CM | POA: Diagnosis not present

## 2016-11-14 DIAGNOSIS — Z6821 Body mass index (BMI) 21.0-21.9, adult: Secondary | ICD-10-CM | POA: Diagnosis not present

## 2016-11-14 DIAGNOSIS — Z8619 Personal history of other infectious and parasitic diseases: Secondary | ICD-10-CM | POA: Diagnosis not present

## 2016-11-14 DIAGNOSIS — Z3040 Encounter for surveillance of contraceptives, unspecified: Secondary | ICD-10-CM | POA: Diagnosis not present

## 2019-05-13 ENCOUNTER — Other Ambulatory Visit: Payer: Self-pay

## 2019-05-13 ENCOUNTER — Encounter: Payer: Self-pay | Admitting: Emergency Medicine

## 2019-05-13 ENCOUNTER — Ambulatory Visit
Admission: EM | Admit: 2019-05-13 | Discharge: 2019-05-13 | Disposition: A | Discharge status: Home / Self Care | Payer: Self-pay | Attending: Emergency Medicine | Admitting: Emergency Medicine

## 2019-05-13 DIAGNOSIS — R059 Cough, unspecified: Secondary | ICD-10-CM

## 2019-05-13 DIAGNOSIS — Z20828 Contact with and (suspected) exposure to other viral communicable diseases: Secondary | ICD-10-CM | POA: Diagnosis not present

## 2019-05-13 DIAGNOSIS — R05 Cough: Secondary | ICD-10-CM

## 2019-05-13 DIAGNOSIS — Z20822 Contact with and (suspected) exposure to covid-19: Secondary | ICD-10-CM

## 2019-05-13 DIAGNOSIS — Z8709 Personal history of other diseases of the respiratory system: Secondary | ICD-10-CM | POA: Diagnosis not present

## 2019-05-13 MED ORDER — ALBUTEROL SULFATE HFA 108 (90 BASE) MCG/ACT IN AERS: 2.0000 | INHALATION_SPRAY | RESPIRATORY_TRACT | 0 refills | Status: DC | PRN

## 2019-05-13 MED ORDER — BENZONATATE 100 MG PO CAPS: 100.0000 mg | ORAL_CAPSULE | Freq: Three times a day (TID) | ORAL | 0 refills | Status: DC

## 2019-05-13 MED ORDER — PREDNISONE 20 MG PO TABS: 20.0000 mg | ORAL_TABLET | Freq: Every day | ORAL | 0 refills | Status: AC

## 2019-05-13 MED ORDER — FLUTICASONE PROPIONATE 50 MCG/ACT NA SUSP: 1.0000 | Freq: Every day | NASAL | 0 refills | Status: DC

## 2019-05-13 NOTE — Discharge Instructions (Addendum)
Your COVID test is pending: Is important you quarantine at home until your results are back. °You may take OTC Tylenol for fever and myalgias.  It is important to drink plenty of water throughout the day to stay hydrated. °If you test positive and later develop severe fever, cough, or shortness of breath, it is recommended that you go to the ER for further evaluation. °

## 2019-05-13 NOTE — ED Provider Notes (Signed)
[facility]    CSN: [id number] Arrival date & time: 05/13/19  2751      History   Chief Complaint Chief Complaint  Patient presents with  . URI    HPI Caroline Hobbs is a 23 y.o. female with history of asthma in childhood, allergies presenting for 3-day course of nasal congestion, productive, non-hemoptic cough, fatigue, sore throat, decreased appetite, loose stool without blood/melena.  Patient is not currently tried anything for her symptoms.  Patient used to use an albuterol inhaler when she was younger, though has not used it since "I was a kid ".  Denies fever, shortness of breath, chest pain.  Patient states that a coworker tested positive for COVID about 5 weeks ago, so she got testing shortly thereafter: Negative result on 10/5.   Past Medical History:  Diagnosis Date  . Acne    Duac gel per Derm, Dr. Nita Sells  . Allergy    mild spring allergies to pollen  . Asthma 2009   No symptoms since 2009. No med use since 2009    Patient Active Problem List   Diagnosis Date Noted  . Allergic rhinitis 12/04/2011  . Allergic conjunctivitis 12/04/2011  . Asthma 12/04/2011  . Acne 12/04/2011    History reviewed. No pertinent surgical history.  OB History   No obstetric history on file.      Home Medications    Prior to Admission medications   Medication Sig Start Date End Date Taking? Authorizing Provider  albuterol (VENTOLIN HFA) 108 (90 Base) MCG/ACT inhaler Inhale 2 puffs into the lungs every 4 (four) hours as needed for wheezing or shortness of breath. 05/13/19   Hall-Potvin, [place], PA-C  benzonatate (TESSALON) 100 MG capsule Take 1 capsule (100 mg total) by mouth every 8 (eight) hours. 05/13/19   Hall-Potvin, [place], PA-C  fluticasone (FLONASE) 50 MCG/ACT nasal spray Place 1 spray into both nostrils daily. 05/13/19   Hall-Potvin, [place], PA-C  ibuprofen (ADVIL,MOTRIN) 200 MG tablet Take 400 mg by mouth every 6 (six) hours as needed for  fever, headache, mild pain or moderate pain.    [provider]  predniSONE (DELTASONE) 20 MG tablet Take 1 tablet (20 mg total) by mouth daily for 7 days. 05/13/19 05/20/19  Hall-Potvin, [place], PA-C  dicyclomine (BENTYL) 20 MG tablet Take 1 tablet (20 mg total) by mouth 2 (two) times daily. Patient not taking: Reported on 01/14/2015 08/19/13 05/27/15  Reuben Likes, MD    Family History Family History  Problem Relation Age of Onset  . Stroke Maternal Grandfather   . Asthma Sister   . Cancer Neg Hx   . Diabetes Neg Hx   . Heart disease Neg Hx   . Hyperlipidemia Neg Hx   . Hypertension Neg Hx     Social History Social History   Tobacco Use  . Smoking status: Never Smoker  . Smokeless tobacco: Never Used  Substance Use Topics  . Alcohol use: No  . Drug use: No     Allergies   Patient has no known allergies.   Review of Systems Review of Systems  Constitutional: Positive for appetite change and fatigue. Negative for fever.  HENT: Positive for congestion and sore throat. Negative for dental problem, ear pain, facial swelling, hearing loss, sinus pain, trouble swallowing and voice change.   Eyes: Negative for photophobia, pain and visual disturbance.  Respiratory: Positive for cough. Negative for shortness of breath and wheezing.   Cardiovascular: Negative for chest pain and  palpitations.  Gastrointestinal: Positive for diarrhea. Negative for abdominal distention, abdominal pain, blood in stool, nausea and vomiting.  Musculoskeletal: Negative for arthralgias and myalgias.  Neurological: Negative for dizziness and headaches.     Physical Exam Triage Vital Signs ED Triage Vitals [05/13/19 0951]  Enc Vitals Group     BP 136/89     Pulse Rate 74     Resp 18     Temp 98.2 F (36.8 C)     Temp Source Oral     SpO2 99 %     Weight      Height      Head Circumference      Peak Flow      Pain Score 0     Pain Loc      Pain Edu?      Excl. in GC?    No  data found.  Updated Vital Signs BP 136/89 (BP Location: Left Arm)   Pulse 74   Temp 98.2 F (36.8 C) (Oral)   Resp 18   LMP 05/09/2019   SpO2 99%    Physical Exam Constitutional:      General: She is not in acute distress.    Appearance: She is ill-appearing. She is not toxic-appearing.  HENT:     Head: Normocephalic and atraumatic.     Jaw: There is normal jaw occlusion. No tenderness or pain on movement.     Right Ear: Hearing, tympanic membrane, ear canal and external ear normal. No tenderness. No mastoid tenderness.     Left Ear: Hearing, tympanic membrane, ear canal and external ear normal. No tenderness. No mastoid tenderness.     Nose: No nasal deformity, septal deviation or nasal tenderness.     Right Turbinates: Not swollen or pale.     Left Turbinates: Not swollen or pale.     Right Sinus: No maxillary sinus tenderness or frontal sinus tenderness.     Left Sinus: No maxillary sinus tenderness or frontal sinus tenderness.     Comments: Bilateral turbinate edema    Mouth/Throat:     Lips: Pink. No lesions.     Mouth: Mucous membranes are moist. No injury.     Pharynx: Oropharynx is clear. Uvula midline. No posterior oropharyngeal erythema or uvula swelling.     Comments: no tonsillar exudate or hypertrophy Eyes:     General: No scleral icterus.    Conjunctiva/sclera: Conjunctivae normal.     Pupils: Pupils are equal, round, and reactive to light.  Neck:     Musculoskeletal: Normal range of motion and neck supple. Muscular tenderness present.  Cardiovascular:     Rate and Rhythm: Normal rate and regular rhythm.     Heart sounds: No murmur. No gallop.   Pulmonary:     Effort: Pulmonary effort is normal. No respiratory distress.     Breath sounds: No stridor. Wheezing and rhonchi present.     Comments: Good air entry bilaterally, no prolonged expiratory phase, though trace end phase wheezing noted bilaterally Chest:     Chest wall: No tenderness.  Lymphadenopathy:      Cervical: Cervical adenopathy present.  Skin:    Capillary Refill: Capillary refill takes less than 2 seconds.     Coloration: Skin is not jaundiced.     Findings: No rash.  Neurological:     General: No focal deficit present.     Mental Status: She is alert and oriented to person, place, and time.      UC  Treatments / Results  Labs (all labs ordered are listed, but only abnormal results are displayed) Labs Reviewed  NOVEL CORONAVIRUS, NAA    EKG   Radiology No results found.  Procedures Procedures (including critical care time)  Medications Ordered in UC Medications - No data to display  Initial Impression / Assessment and Plan / UC Course  I have reviewed the triage vital signs and the nursing notes.  Pertinent labs & imaging results that were available during my care of the patient were reviewed by me and considered in my medical decision making (see chart for details).     Patient afebrile, hemodynamically stable with good O2 saturation.  Offered albuterol MDI in office: Patient declined, preferring to try at home.  COVID test pending: Patient to quarantine.  Due to history of allergies, asthma in childhood, will add prednisone to symptomatic treatment as outlined below.  Low concern for pneumonia at this time due to lack of fever, short duration of symptoms, suspicion for COVID-19 infection.  If negative, discussed that patient may need to return within weeks time if still having persistent, worsening symptoms for radiography at that time.  Return precautions discussed, patient verbalized understanding and is agreeable to plan. Final Clinical Impressions(s) / UC Diagnoses   Final diagnoses:  Cough  Suspected COVID-19 virus infection     Discharge Instructions     Your COVID test is pending: Is important you quarantine at home until your results are back. You may take OTC Tylenol for fever and myalgias.  It is important to drink plenty of water throughout  the day to stay hydrated. If you test positive and later develop severe fever, cough, or shortness of breath, it is recommended that you go to the ER for further evaluation.    ED Prescriptions    Medication Sig Dispense Auth. Provider   fluticasone (FLONASE) 50 MCG/ACT nasal spray Place 1 spray into both nostrils daily. 16 g Hall-Potvin, [place], PA-C   albuterol (VENTOLIN HFA) 108 (90 Base) MCG/ACT inhaler Inhale 2 puffs into the lungs every 4 (four) hours as needed for wheezing or shortness of breath. 18 g Hall-Potvin, [place], PA-C   predniSONE (DELTASONE) 20 MG tablet Take 1 tablet (20 mg total) by mouth daily for 7 days. 7 tablet Hall-Potvin, [place], PA-C   benzonatate (TESSALON) 100 MG capsule Take 1 capsule (100 mg total) by mouth every 8 (eight) hours. 21 capsule Hall-Potvin, [place], PA-C     PDMP not reviewed this encounter.   Hall-Potvin, [place], [place] 05/13/19 1228

## 2019-05-13 NOTE — ED Triage Notes (Addendum)
PT presents to [facility] for assessment of nasal congestion, cough with associated central chest pain, fatigue, sore throat x 2-3 days.  Patient states she noticed this morning while having a bowel movement she had a small amount of feces plus mucous, and was concerned  +

## 2019-05-13 NOTE — ED Notes (Signed)
Patient able to ambulate independently  

## 2019-05-14 LAB — NOVEL CORONAVIRUS, NAA: SARS-CoV-2, NAA: NOT DETECTED

## 2019-09-28 ENCOUNTER — Ambulatory Visit (HOSPITAL_COMMUNITY)
Admission: EM | Admit: 2019-09-28 | Discharge: 2019-09-28 | Disposition: A | Discharge status: Home / Self Care | Payer: Self-pay | Attending: Family Medicine | Admitting: Family Medicine

## 2019-09-28 ENCOUNTER — Encounter (HOSPITAL_COMMUNITY): Payer: Self-pay

## 2019-09-28 ENCOUNTER — Other Ambulatory Visit: Payer: Self-pay

## 2019-09-28 ENCOUNTER — Ambulatory Visit (INDEPENDENT_AMBULATORY_CARE_PROVIDER_SITE_OTHER): Payer: Self-pay

## 2019-09-28 DIAGNOSIS — S99921A Unspecified injury of right foot, initial encounter: Secondary | ICD-10-CM

## 2019-09-28 DIAGNOSIS — S90221A Contusion of right lesser toe(s) with damage to nail, initial encounter: Secondary | ICD-10-CM

## 2019-09-28 MED ORDER — IBUPROFEN 800 MG PO TABS: 800.0000 mg | ORAL_TABLET | Freq: Three times a day (TID) | ORAL | 0 refills | Status: DC

## 2019-09-28 NOTE — ED Triage Notes (Signed)
Pt presents with right 4th toe injury from a week ago when she stubbed it on her vacuum cleaner.

## 2019-09-28 NOTE — Discharge Instructions (Signed)
No fracture Use anti-inflammatories for pain/swelling. You may take up to 800 mg Ibuprofen every 8 hours with food. You may supplement Ibuprofen with Tylenol [phone] mg every 8 hours.  Ice and elevate Buddy tape for comfort  Follow up for any concerns

## 2019-09-28 NOTE — ED Provider Notes (Signed)
[facility]    CSN: [id number] Arrival date & time: 09/28/19  1506      History   Chief Complaint Chief Complaint  Patient presents with  . Toe Injury    HPI Caroline Hobbs is a 24 y.o. female no significant past medical history presenting today for evaluation of right toe/foot injury.  Patient states that approximately 1 week ago she accidentally stubbed her foot on a vacuum cleaner.  Since she has had pain swelling and bruising to her fourth toe.  She also has pain extending into her foot.  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   HPI  Past Medical History:  Diagnosis Date  . Acne    Duac gel per Derm, Dr. Nita Sells  . Allergy    mild spring allergies to pollen  . Asthma 2009   No symptoms since 2009. No med use since 2009    Patient Active Problem List   Diagnosis Date Noted  . Allergic rhinitis 12/04/2011  . Allergic conjunctivitis 12/04/2011  . Asthma 12/04/2011  . Acne 12/04/2011    History reviewed. No pertinent surgical history.  OB History   No obstetric history on file.      Home Medications    Prior to Admission medications   Medication Sig Start Date End Date Taking? Authorizing Provider  albuterol (VENTOLIN HFA) 108 (90 Base) MCG/ACT inhaler Inhale 2 puffs into the lungs every 4 (four) hours as needed for wheezing or shortness of breath. 05/13/19   Hall-Potvin, [place], PA-C  fluticasone (FLONASE) 50 MCG/ACT nasal spray Place 1 spray into both nostrils daily. 05/13/19   Hall-Potvin, [place], PA-C  ibuprofen (ADVIL) 800 MG tablet Take 1 tablet (800 mg total) by mouth 3 (three) times daily. 09/28/19   Shawanda Sievert C, PA-C  dicyclomine (BENTYL) 20 MG tablet Take 1 tablet (20 mg total) by mouth 2 (two) times daily. Patient not taking: Reported on 01/14/2015 08/19/13 05/27/15  Reuben Likes, MD    Family History Family  History  Problem Relation Age of Onset  . Stroke Maternal Grandfather   . Asthma Sister   . Cancer Neg Hx   . Diabetes Neg Hx   . Heart disease Neg Hx   . Hyperlipidemia Neg Hx   . Hypertension Neg Hx     Social History Social History   Tobacco Use  . Smoking status: Never Smoker  . Smokeless tobacco: Never Used  Substance Use Topics  . Alcohol use: No  . Drug use: No     Allergies   Patient has no known allergies.   Review of Systems Review of Systems  Constitutional: Negative for fatigue and fever.  Eyes: Negative for visual disturbance.  Respiratory: Negative for shortness of breath.   Cardiovascular: Negative for chest pain.  Gastrointestinal: Negative for abdominal pain, nausea and vomiting.  Genitourinary: Negative for genital sores.  Musculoskeletal: Positive for arthralgias, gait problem and joint swelling.  Skin: Positive for color change. Negative for rash and wound.  Neurological: Negative for dizziness, weakness, light-headedness and headaches.     Physical Exam Triage Vital Signs ED Triage Vitals  Enc Vitals Group     BP 09/28/19 1607 138/85     Pulse Rate 09/28/19 1607 71     Resp 09/28/19 1607 18     Temp 09/28/19 1607 98.2 F (36.8 C)  Temp Source 09/28/19 1607 Oral     SpO2 09/28/19 1607 100 %     Weight --      Height --      Head Circumference --      Peak Flow --      Pain Score 09/28/19 1606 7     Pain Loc --      Pain Edu? --      Excl. in GC? --    No data found.  Updated Vital Signs BP 138/85 (BP Location: Right Arm)   Pulse 71   Temp 98.2 F (36.8 C) (Oral)   Resp 18   LMP 09/07/2019   SpO2 100%   Visual Acuity Right Eye Distance:   Left Eye Distance:   Bilateral Distance:    Right Eye Near:   Left Eye Near:    Bilateral Near:     Physical Exam Vitals and nursing note reviewed.  Constitutional:      Appearance: She is well-developed.     Comments: No acute distress  HENT:     Head: Normocephalic and  atraumatic.     Nose: Nose normal.  Eyes:     Conjunctiva/sclera: Conjunctivae normal.  Cardiovascular:     Rate and Rhythm: Normal rate.  Pulmonary:     Effort: Pulmonary effort is normal. No respiratory distress.  Abdominal:     General: There is no distension.  Musculoskeletal:        General: Normal range of motion.     Cervical back: Neck supple.     Comments: Right foot: No obvious swelling or discoloration noted throughout dorsum of foot, bruising and purplish discoloration to fourth toe, no polish present on toenail, but around cuticle and edges does appear to have subungual hematoma, significantly sensitive to touch of distal toe as well as with any manipulation of toe, tenderness throughout fourth metatarsal, dorsalis pedis 2+, nontender at medial and lateral malleolus  Skin:    General: Skin is warm and dry.  Neurological:     Mental Status: She is alert and oriented to person, place, and time.      UC Treatments / Results  Labs (all labs ordered are listed, but only abnormal results are displayed) Labs Reviewed - No data to display  EKG   Radiology DG Foot Complete Right  Result Date: 09/28/2019 CLINICAL DATA:  Right foot pain, injury EXAM: RIGHT FOOT COMPLETE - 3+ VIEW COMPARISON:  None. FINDINGS: There is no evidence of fracture or dislocation. There is no evidence of arthropathy or other focal bone abnormality. Soft tissues are unremarkable. IMPRESSION: Negative. Electronically Signed   By: Jasmine Pang M.D.   On: 09/28/2019 16:28    Procedures Procedures (including critical care time)  Medications Ordered in UC Medications - No data to display  Initial Impression / Assessment and Plan / UC Course  I have reviewed the triage vital signs and the nursing notes.  Pertinent labs & imaging results that were available during my care of the patient were reviewed by me and considered in my medical decision making (see chart for details).     X-ray negative for  acute bony abnormality.  Most likely soft tissue swelling/contusion along with sensitivity from subungual hematoma.  Given this has been present for 1 week now, deferring drainage is likely would not provide much benefit at this time.  Advised to continue to monitor for gradual resolution of symptoms, may buddy tape for comfort, ice, elevation and anti-inflammatories.  Discussed strict  return precautions. Patient verbalized understanding and is agreeable with plan.  Final Clinical Impressions(s) / UC Diagnoses   Final diagnoses:  Subungual hematoma of toe of right foot, initial encounter  Injury of toe on right foot, initial encounter     Discharge Instructions     No fracture Use anti-inflammatories for pain/swelling. You may take up to 800 mg Ibuprofen every 8 hours with food. You may supplement Ibuprofen with Tylenol [phone] mg every 8 hours.  Ice and elevate Buddy tape for comfort  Follow up for any concerns    ED Prescriptions    Medication Sig Dispense Auth. Provider   ibuprofen (ADVIL) 800 MG tablet Take 1 tablet (800 mg total) by mouth 3 (three) times daily. 21 tablet Hila Bolding, [place] C, PA-C     PDMP not reviewed this encounter.   Janith Lima, [place] 09/29/19 [phone]

## 2019-12-30 ENCOUNTER — Ambulatory Visit (HOSPITAL_COMMUNITY)
Admission: EM | Admit: 2019-12-30 | Discharge: 2019-12-30 | Disposition: A | Discharge status: Home / Self Care | Payer: Self-pay | Attending: Family Medicine | Admitting: Family Medicine

## 2019-12-30 ENCOUNTER — Encounter (HOSPITAL_COMMUNITY): Payer: Self-pay

## 2019-12-30 ENCOUNTER — Other Ambulatory Visit: Payer: Self-pay

## 2019-12-30 DIAGNOSIS — K0889 Other specified disorders of teeth and supporting structures: Secondary | ICD-10-CM

## 2019-12-30 MED ORDER — PENICILLIN V POTASSIUM 500 MG PO TABS: 500.0000 mg | ORAL_TABLET | Freq: Three times a day (TID) | ORAL | 0 refills | Status: DC

## 2019-12-30 MED ORDER — HYDROCODONE-ACETAMINOPHEN 5-325 MG PO TABS: 1.0000 | ORAL_TABLET | Freq: Four times a day (QID) | ORAL | 0 refills | Status: DC | PRN

## 2019-12-30 NOTE — ED Triage Notes (Signed)
Pt states her tooth on the top right side broke while she was eating 2 days ago. Pt has HA. Pt has 1+ edema of right side of face. Pt states can't chew on that side.

## 2019-12-30 NOTE — Discharge Instructions (Signed)

## 2020-01-02 NOTE — ED Provider Notes (Signed)
[facility]   [id number] 12/30/19 Arrival Time: 1500  ASSESSMENT & PLAN:  1. Pain, dental     No sign of abscess requiring I&D at this time. Discussed.   Meds ordered this encounter  Medications  . penicillin v potassium (VEETID) 500 MG tablet    Sig: Take 1 tablet (500 mg total) by mouth 3 (three) times daily.    Dispense:  30 tablet    Refill:  0  . HYDROcodone-acetaminophen (NORCO/VICODIN) 5-325 MG tablet    Sig: Take 1 tablet by mouth every 6 (six) hours as needed for moderate pain or severe pain.    Dispense:  8 tablet    Refill:  0     Controlled Substances Registry consulted for this patient. I feel the risk/benefit ratio today is favorable for proceeding with this prescription for a controlled substance. Medication sedation precautions given.  Dental resource written instructions given. She will schedule dental evaluation as soon as possible if not improving over the next 24-48 hours.  Reviewed expectations re: course of current medical issues. Questions answered. Outlined signs and symptoms indicating need for more acute intervention. Patient verbalized understanding. After Visit Summary given.   SUBJECTIVE:  Caroline Hobbs is a 24 y.o. female who reports abrupt onset of right upper rear molar dental pain described as aching/throbbing. Present for two day; 'think a tooth broke while I was eating'. Fever: absent. Tolerating PO intake but reports pain with chewing. Normal swallowing. She does not see a dentist regularly. No neck swelling or pain. OTC analgesics without relief.  ROS: As per HPI.  OBJECTIVE: Vitals:   12/30/19 1531 12/30/19 1534  BP: (!) 147/97   Pulse: 79   Resp: 18   Temp: 99.2 F (37.3 C)   TempSrc: Oral   SpO2: 100%   Weight:  65.8 kg  Height:  5\' 6"  (1.676 m)    General appearance: alert; no distress HENT: normocephalic; atraumatic; dentition: fair; right upper gums without areas of fluctuance, drainage, or bleeding and  with tenderness to palpation; rear molar does appear broken; normal jaw movement without difficulty Neck: supple without LAD; FROM; trachea midline Lungs: normal respirations; unlabored; speaks full sentences without difficulty Skin: warm and dry Psychological: alert and cooperative; normal mood and affect  No Known Allergies  Past Medical History:  Diagnosis Date  . Acne    Duac gel per Derm, Dr.  . Allergy    mild spring allergies to pollen  . Asthma 2009   No symptoms since 2009. No med use since 2009   Social History   Socioeconomic History  . Marital status: Single    Spouse name: Not on file  . Number of children: Not on file  . Years of education: Not on file  . Highest education level: Not on file  Occupational History  . Not on file  Tobacco Use  . Smoking status: Never Smoker  . Smokeless tobacco: Never Used  Substance and Sexual Activity  . Alcohol use: No  . Drug use: No  . Sexual activity: Yes    Birth control/protection: None  Other Topics Concern  . Not on file  Social History Narrative  . Not on file   Social Determinants of Health   Financial Resource Strain:   . Difficulty of Paying Living Expenses:   Food Insecurity:   . Worried About 2010 in the Last Year:   . Programme researcher, broadcasting/film/video of Food in the Last Year:  Transportation Needs:   . Film/video editor (Medical):   Marland Kitchen Lack of Transportation (Non-Medical):   Physical Activity:   . Days of Exercise per Week:   . Minutes of Exercise per Session:   Stress:   . Feeling of Stress :   Social Connections:   . Frequency of Communication with Friends and Family:   . Frequency of Social Gatherings with Friends and Family:   . Attends Religious Services:   . Active Member of Clubs or Organizations:   . Attends Archivist Meetings:   Marland Kitchen Marital Status:   Intimate Partner Violence:   . Fear of Current or Ex-Partner:   . Emotionally Abused:   Marland Kitchen Physically Abused:   .  Sexually Abused:    Family History  Problem Relation Age of Onset  . Stroke Maternal Grandfather   . Asthma Sister   . Cancer Neg Hx   . Diabetes Neg Hx   . Heart disease Neg Hx   . Hyperlipidemia Neg Hx   . Hypertension Neg Hx    History reviewed. No pertinent surgical history.   Vanessa Kick, MD 01/02/20 [phone]

## 2020-02-15 ENCOUNTER — Ambulatory Visit: Payer: Self-pay | Admitting: Obstetrics

## 2020-02-29 ENCOUNTER — Other Ambulatory Visit (HOSPITAL_COMMUNITY)
Admission: RE | Admit: 2020-02-29 | Discharge: 2020-02-29 | Disposition: A | Discharge status: Home / Self Care | Payer: PRIVATE HEALTH INSURANCE | Source: Ambulatory Visit | Attending: Obstetrics | Admitting: Obstetrics

## 2020-02-29 ENCOUNTER — Other Ambulatory Visit: Payer: Self-pay

## 2020-02-29 ENCOUNTER — Encounter: Payer: Self-pay | Admitting: Obstetrics

## 2020-02-29 ENCOUNTER — Ambulatory Visit (INDEPENDENT_AMBULATORY_CARE_PROVIDER_SITE_OTHER): Payer: PRIVATE HEALTH INSURANCE | Admitting: Obstetrics

## 2020-02-29 VITALS — BP 154/101 | HR 80 | Ht 66.0 in | Wt 149.0 lb

## 2020-02-29 DIAGNOSIS — Z01419 Encounter for gynecological examination (general) (routine) without abnormal findings: Secondary | ICD-10-CM | POA: Insufficient documentation

## 2020-02-29 DIAGNOSIS — N898 Other specified noninflammatory disorders of vagina: Secondary | ICD-10-CM

## 2020-02-29 DIAGNOSIS — Z30011 Encounter for initial prescription of contraceptive pills: Secondary | ICD-10-CM

## 2020-02-29 DIAGNOSIS — L0232 Furuncle of buttock: Secondary | ICD-10-CM

## 2020-02-29 DIAGNOSIS — Z3009 Encounter for other general counseling and advice on contraception: Secondary | ICD-10-CM

## 2020-02-29 DIAGNOSIS — Z3169 Encounter for other general counseling and advice on procreation: Secondary | ICD-10-CM

## 2020-02-29 MED ORDER — BALCOLTRA 0.1-20 MG-MCG(21) PO TABS: 1.0000 | ORAL_TABLET | Freq: Every day | ORAL | 11 refills | Status: DC

## 2020-02-29 MED ORDER — PRENATAL PLUS 27-1 MG PO TABS: 1.0000 | ORAL_TABLET | Freq: Every day | ORAL | 11 refills | Status: DC

## 2020-02-29 MED ORDER — CEPHALEXIN 500 MG PO CAPS: 500.0000 mg | ORAL_CAPSULE | Freq: Three times a day (TID) | ORAL | 2 refills | Status: DC

## 2020-02-29 NOTE — Progress Notes (Signed)
Subjective:        Caroline Hobbs is a 24 y.o. female here for a routine exam.  Current complaints: Boil on buttock.  Personal health :  Is patient Ashkenazi Jewish, have a family history of breast and/or ovarian cancer: no Is there a family history of uterine cancer diagnosed at age < 73, gastrointestinal cancer, urinary tract cancer, family member who is a Personnel officer syndrome-associated carrier: no Is the patient overweight and hypertensive, family history of diabetes, personal history of gestational diabetes, preeclampsia or PCOS: no Is patient over 34, have PCOS,  family history of premature CHD under age 29, diabetes, smoke, have hypertension or peripheral artery disease:  no At any time, has a partner hit, kicked or otherwise hurt or frightened you?: no Over the past 2 weeks, have you felt down, depressed or hopeless?: no Over the past 2 weeks, have you felt little interest or pleasure in doing things?:no   Gynecologic History Patient's last menstrual period was 02/11/2020. Contraception: none Last Pap: 2019. Results were: normal Last mammogram: n/a. Results were: n/a  Obstetric History OB History  No obstetric history on file.    Past Medical History:  Diagnosis Date  . Acne    Duac gel per Derm, Dr. Nita Sells  . Allergy    mild spring allergies to pollen  . Asthma 2009   No symptoms since 2009. No med use since 2009    History reviewed. No pertinent surgical history.   Current Outpatient Medications:  .  cephALEXin (KEFLEX) 500 MG capsule, Take 1 capsule (500 mg total) by mouth 3 (three) times daily., Disp: 21 capsule, Rfl: 2 .  HYDROcodone-acetaminophen (NORCO/VICODIN) 5-325 MG tablet, Take 1 tablet by mouth every 6 (six) hours as needed for moderate pain or severe pain. (Patient not taking: Reported on 02/29/2020), Disp: 8 tablet, Rfl: 0 .  Levonorgest-Eth Estrad-Fe Bisg (BALCOLTRA) 0.1-20 MG-MCG(21) TABS, Take 1 tablet by mouth daily., Disp: 28 tablet, Rfl: 11 .   penicillin v potassium (VEETID) 500 MG tablet, Take 1 tablet (500 mg total) by mouth 3 (three) times daily. (Patient not taking: Reported on 02/29/2020), Disp: 30 tablet, Rfl: 0 .  prenatal vitamin w/FE, FA (PRENATAL 1 + 1) 27-1 MG TABS tablet, Take 1 tablet by mouth daily before breakfast., Disp: 30 tablet, Rfl: 11 No Known Allergies  Social History   Tobacco Use  . Smoking status: Never Smoker  . Smokeless tobacco: Never Used  Substance Use Topics  . Alcohol use: No    Family History  Problem Relation Age of Onset  . Stroke Maternal Grandfather   . Asthma Sister   . Cancer Neg Hx   . Diabetes Neg Hx   . Heart disease Neg Hx   . Hyperlipidemia Neg Hx   . Hypertension Neg Hx       Review of Systems  Constitutional: negative for fatigue and weight loss Respiratory: negative for cough and wheezing Cardiovascular: negative for chest pain, fatigue and palpitations Gastrointestinal: negative for abdominal pain and change in bowel habits Musculoskeletal:negative for myalgias Neurological: negative for gait problems and tremors Behavioral/Psych: negative for abusive relationship, depression Endocrine: negative for temperature intolerance    Genitourinary:negative for abnormal menstrual periods, genital lesions, hot flashes, sexual problems and vaginal discharge Integument/breast: negative for breast lump, breast tenderness, nipple discharge and skin lesion(s)    Objective:       BP (!) 154/101   Pulse 80   Ht 5\' 6"  (1.676 m)   Wt 149  lb (67.6 kg)   LMP 02/11/2020   BMI 24.05 kg/m  General:   alert and no distress  Skin:   no rash or abnormalities  Lungs:   clear to auscultation bilaterally  Heart:   regular rate and rhythm, S1, S2 normal, no murmur, click, rub or gallop  Breasts:   normal without suspicious masses, skin or nipple changes or axillary nodes  Abdomen:  normal findings: no organomegaly, soft, non-tender and no hernia  Pelvis:  External genitalia: normal  general appearance Urinary system: urethral meatus normal and bladder without fullness, nontender Vaginal: normal without tenderness, induration or masses Cervix: normal appearance Adnexa: normal bimanual exam Uterus: anteverted and non-tender, normal size   Lab Review Urine pregnancy test Labs reviewed yes Radiologic studies reviewed no  50% of 20 min visit spent on counseling and coordination of care.   Assessment:     1. Encounter for routine gynecological examination with Papanicolaou smear of cervix Rx - Cytology - PAP( [facility])  2. Vaginal discharge Rx: - Cervicovaginal ancillary only( [facility])  3. Boil of buttock Rx: - cephALEXin (KEFLEX) 500 MG capsule; Take 1 capsule (500 mg total) by mouth 3 (three) times daily.  Dispense: 21 capsule; Refill: 2  4. Encounter for other general counseling or advice on contraception - wants OCP's - would like to conceive in the future  5. Encounter for initial prescription of contraceptive pills Rx: - Levonorgest-Eth Estrad-Fe Bisg (BALCOLTRA) 0.1-20 MG-MCG(21) TABS; Take 1 tablet by mouth daily.  Dispense: 28 tablet; Refill: 11  6. Encounter for preconception consultation - Folic Acid Rx    Plan:    Education reviewed: calcium supplements, depression evaluation, low fat, low cholesterol diet, safe sex/STD prevention, self breast exams and weight bearing exercise. Contraception: OCP (estrogen/progesterone). Follow up in: 1 year.   Meds ordered this encounter  Medications  . Levonorgest-Eth Estrad-Fe Bisg (BALCOLTRA) 0.1-20 MG-MCG(21) TABS    Sig: Take 1 tablet by mouth daily.    Dispense:  28 tablet    Refill:  11  . prenatal vitamin w/FE, FA (PRENATAL 1 + 1) 27-1 MG TABS tablet    Sig: Take 1 tablet by mouth daily before breakfast.    Dispense:  30 tablet    Refill:  11  . cephALEXin (KEFLEX) 500 MG capsule    Sig: Take 1 capsule (500 mg total) by mouth 3 (three) times daily.    Dispense:  21 capsule     Refill:  2     Brock Bad, MD 02/29/2020 12:08 PM

## 2020-02-29 NOTE — Progress Notes (Signed)
Pt is in the office for annual. Last pap 05-07-2018 Pt is not on [facility] and declines today. LMP 02-11-20 GAD-7=6

## 2020-03-01 ENCOUNTER — Other Ambulatory Visit: Payer: Self-pay | Admitting: Obstetrics

## 2020-03-01 ENCOUNTER — Telehealth: Payer: Self-pay

## 2020-03-01 DIAGNOSIS — N760 Acute vaginitis: Secondary | ICD-10-CM

## 2020-03-01 DIAGNOSIS — B9689 Other specified bacterial agents as the cause of diseases classified elsewhere: Secondary | ICD-10-CM

## 2020-03-01 LAB — CERVICOVAGINAL ANCILLARY ONLY
Bacterial Vaginitis (gardnerella): POSITIVE — AB
Candida Glabrata: NEGATIVE
Candida Vaginitis: NEGATIVE
Chlamydia: NEGATIVE
Comment: NEGATIVE
Comment: NEGATIVE
Comment: NEGATIVE
Comment: NEGATIVE
Comment: NEGATIVE
Comment: NORMAL
Neisseria Gonorrhea: NEGATIVE
Trichomonas: NEGATIVE

## 2020-03-01 MED ORDER — METRONIDAZOLE 500 MG PO TABS: 500.0000 mg | ORAL_TABLET | Freq: Two times a day (BID) | ORAL | 2 refills | Status: DC

## 2020-03-01 NOTE — Telephone Encounter (Signed)
Called to advise of results and rx, no answer, unable to leave vm

## 2020-03-02 ENCOUNTER — Telehealth: Payer: Self-pay

## 2020-03-02 NOTE — Telephone Encounter (Signed)
Pt made aware of results of +BV and voiced understand or Tx Rx Pap still pending.

## 2020-03-03 LAB — CYTOLOGY - PAP: Diagnosis: NEGATIVE

## 2020-06-01 ENCOUNTER — Other Ambulatory Visit: Payer: Self-pay

## 2020-06-01 ENCOUNTER — Ambulatory Visit (HOSPITAL_COMMUNITY)
Admission: EM | Admit: 2020-06-01 | Discharge: 2020-06-01 | Disposition: A | Discharge status: Home / Self Care | Payer: PRIVATE HEALTH INSURANCE | Attending: Family Medicine | Admitting: Family Medicine

## 2020-06-01 ENCOUNTER — Encounter (HOSPITAL_COMMUNITY): Payer: Self-pay

## 2020-06-01 DIAGNOSIS — R112 Nausea with vomiting, unspecified: Secondary | ICD-10-CM | POA: Insufficient documentation

## 2020-06-01 DIAGNOSIS — R109 Unspecified abdominal pain: Secondary | ICD-10-CM | POA: Insufficient documentation

## 2020-06-01 DIAGNOSIS — Z79899 Other long term (current) drug therapy: Secondary | ICD-10-CM | POA: Insufficient documentation

## 2020-06-01 DIAGNOSIS — R519 Headache, unspecified: Secondary | ICD-10-CM | POA: Insufficient documentation

## 2020-06-01 DIAGNOSIS — Z20822 Contact with and (suspected) exposure to covid-19: Secondary | ICD-10-CM | POA: Insufficient documentation

## 2020-06-01 DIAGNOSIS — I10 Essential (primary) hypertension: Secondary | ICD-10-CM | POA: Insufficient documentation

## 2020-06-01 LAB — CBC
HCT: 40.9 % (ref 36.0–46.0)
Hemoglobin: 12.6 g/dL (ref 12.0–15.0)
MCH: 24.1 pg — ABNORMAL LOW (ref 26.0–34.0)
MCHC: 30.8 g/dL (ref 30.0–36.0)
MCV: 78.4 fL — ABNORMAL LOW (ref 80.0–100.0)
Platelets: 281 10*3/uL (ref 150–400)
RBC: 5.22 MIL/uL — ABNORMAL HIGH (ref 3.87–5.11)
RDW: 13.6 % (ref 11.5–15.5)
WBC: 6.4 10*3/uL (ref 4.0–10.5)
nRBC: 0 % (ref 0.0–0.2)

## 2020-06-01 LAB — POCT URINALYSIS DIPSTICK, ED / UC
Bilirubin Urine: NEGATIVE
Glucose, UA: NEGATIVE mg/dL
Ketones, ur: NEGATIVE mg/dL
Leukocytes,Ua: NEGATIVE
Nitrite: NEGATIVE
Protein, ur: NEGATIVE mg/dL
Specific Gravity, Urine: 1.025 (ref 1.005–1.030)
Urobilinogen, UA: 0.2 mg/dL (ref 0.0–1.0)
pH: 6 (ref 5.0–8.0)

## 2020-06-01 LAB — BASIC METABOLIC PANEL
Anion gap: 8 (ref 5–15)
BUN: 10 mg/dL (ref 6–20)
CO2: 26 mmol/L (ref 22–32)
Calcium: 9.7 mg/dL (ref 8.9–10.3)
Chloride: 105 mmol/L (ref 98–111)
Creatinine, Ser: 0.79 mg/dL (ref 0.44–1.00)
GFR, Estimated: >60 mL/min (ref >60)
Glucose, Bld: 84 mg/dL (ref 70–99)
Potassium: 3.9 mmol/L (ref 3.5–5.1)
Sodium: 139 mmol/L (ref 135–145)

## 2020-06-01 LAB — SARS CORONAVIRUS 2 (TAT 6-24 HRS): SARS Coronavirus 2: NEGATIVE

## 2020-06-01 LAB — POC URINE PREG, ED: Preg Test, Ur: NEGATIVE

## 2020-06-01 MED ORDER — ONDANSETRON 4 MG PO TBDP
4.0000 mg | ORAL_TABLET | Freq: Once | ORAL | Status: AC
  Administered 2020-06-01: 4 mg via ORAL

## 2020-06-01 MED ORDER — KETOROLAC TROMETHAMINE 30 MG/ML IJ SOLN
INTRAMUSCULAR | Status: AC
  Filled 2020-06-01: qty 1

## 2020-06-01 MED ORDER — ONDANSETRON 4 MG PO TBDP
ORAL_TABLET | ORAL | Status: AC
  Filled 2020-06-01: qty 1

## 2020-06-01 MED ORDER — KETOROLAC TROMETHAMINE 30 MG/ML IJ SOLN
30.0000 mg | Freq: Once | INTRAMUSCULAR | Status: AC
  Administered 2020-06-01: 30 mg via INTRAMUSCULAR

## 2020-06-01 MED ORDER — HYDROCHLOROTHIAZIDE 12.5 MG PO TABS: 12.5000 mg | ORAL_TABLET | Freq: Every day | ORAL | 0 refills | Status: DC

## 2020-06-01 NOTE — ED Provider Notes (Signed)
[facility]    CSN: [id number] Arrival date & time: 06/01/20  4098      History   Chief Complaint Chief Complaint  Patient presents with   Hypertension   Headache    HPI Caroline Hobbs is a 24 y.o. female.   Caroline Hobbs presents with complaints of headache, nausea, vomiting and concern about her blood pressure. During a pre-employment exam was noted to be hypertensive, has been checking it daily since and it can range in the 160's-170's regularly. Often she feels fine no matter the value. Intermittently she has noted headaches. Today with nausea, mild abdominal pain, and vomited once. She had a BM this morning which did help with abdominal pain. No chest pain but describes chest "fluttering." no shortness of breath. Has noted at night she has been sweating at times. No personal or family cardiac history. No dizziness. Doesn't take any medications for her blood pressure. Her insurance starts this weekend so plans to establish with a PCP at that time.      Past Medical History:  Diagnosis Date   Acne    Duac gel per Derm, Dr. Nita Sells   Allergy    mild spring allergies to pollen   Asthma 2009   No symptoms since 2009. No med use since 2009    Patient Active Problem List   Diagnosis Date Noted   Allergic rhinitis 12/04/2011   Allergic conjunctivitis 12/04/2011   Asthma 12/04/2011   Acne 12/04/2011    History reviewed. No pertinent surgical history.  OB History   No obstetric history on file.      Home Medications    Prior to Admission medications   Medication Sig Start Date End Date Taking? Authorizing Provider  cephALEXin (KEFLEX) 500 MG capsule Take 1 capsule (500 mg total) by mouth 3 (three) times daily. 02/29/20   Brock Bad, MD  hydrochlorothiazide (HYDRODIURIL) 12.5 MG tablet Take 1 tablet (12.5 mg total) by mouth daily. 06/01/20   Georgetta Haber, NP  HYDROcodone-acetaminophen (NORCO/VICODIN) 5-325 MG tablet Take 1  tablet by mouth every 6 (six) hours as needed for moderate pain or severe pain. Patient not taking: Reported on 02/29/2020 12/30/19   Mardella Layman, MD  Levonorgest-Eth Estrad-Fe Bisg (BALCOLTRA) 0.1-20 MG-MCG(21) TABS Take 1 tablet by mouth daily. 02/29/20   Brock Bad, MD  metroNIDAZOLE (FLAGYL) 500 MG tablet Take 1 tablet (500 mg total) by mouth 2 (two) times daily. 03/01/20   Brock Bad, MD  penicillin v potassium (VEETID) 500 MG tablet Take 1 tablet (500 mg total) by mouth 3 (three) times daily. Patient not taking: Reported on 02/29/2020 12/30/19   Mardella Layman, MD  prenatal vitamin w/FE, FA (PRENATAL 1 + 1) 27-1 MG TABS tablet Take 1 tablet by mouth daily before breakfast. 02/29/20   Brock Bad, MD  albuterol (VENTOLIN HFA) 108 (90 Base) MCG/ACT inhaler Inhale 2 puffs into the lungs every 4 (four) hours as needed for wheezing or shortness of breath. 05/13/19 12/30/19  Hall-Potvin, [place], PA-C  dicyclomine (BENTYL) 20 MG tablet Take 1 tablet (20 mg total) by mouth 2 (two) times daily. Patient not taking: Reported on 01/14/2015 08/19/13 05/27/15  Reuben Likes, MD  fluticasone [facility]) 50 MCG/ACT nasal spray Place 1 spray into both nostrils daily. 05/13/19 12/30/19  Hall-Potvin, [place], PA-C    Family History Family History  Problem Relation Age of Onset   Stroke Maternal Grandfather    Asthma Sister    Cancer  Neg Hx    Diabetes Neg Hx    Heart disease Neg Hx    Hyperlipidemia Neg Hx    Hypertension Neg Hx     Social History Social History   Tobacco Use   Smoking status: Never Smoker   Smokeless tobacco: Never Used  Substance Use Topics   Alcohol use: No   Drug use: No     Allergies   Patient has no known allergies.   Review of Systems Review of Systems   Physical Exam Triage Vital Signs ED Triage Vitals  Enc Vitals Group     BP 06/01/20 1038 (!) 167/110     Pulse Rate 06/01/20 1038 71     Resp 06/01/20 1038 20     Temp 06/01/20 1038 99 F  (37.2 C)     Temp Source 06/01/20 1038 Oral     SpO2 06/01/20 1038 98 %     Weight --      Height --      Head Circumference --      Peak Flow --      Pain Score 06/01/20 1040 0     Pain Loc --      Pain Edu? --      Excl. in GC? --    No data found.  Updated Vital Signs BP (!) 161/99 (BP Location: Right Arm)    Pulse 75    Temp 99 F (37.2 C) (Oral)    Resp 20    LMP  (Within Weeks) Comment: 1 week   SpO2 99%    Physical Exam Constitutional:      General: She is not in acute distress.    Appearance: She is well-developed.  Eyes:     Extraocular Movements: Extraocular movements intact.     Pupils: Pupils are equal, round, and reactive to light.  Cardiovascular:     Rate and Rhythm: Normal rate.  Pulmonary:     Effort: Pulmonary effort is normal.  Skin:    General: Skin is warm and dry.  Neurological:     Mental Status: She is alert and oriented to person, place, and time.    EKG:  Sinus bradycardia . Previous EKG was not available for review. No stwave changes as interpreted by me.  Reviewed with supervising physician dr. Tracie Harrier   UC Treatments / Results  Labs (all labs ordered are listed, but only abnormal results are displayed) Labs Reviewed  CBC - Abnormal; Notable for the following components:      Result Value   RBC 5.22 (*)    MCV 78.4 (*)    MCH 24.1 (*)    All other components within normal limits  POCT URINALYSIS DIPSTICK, ED / UC - Abnormal; Notable for the following components:   Hgb urine dipstick TRACE (*)    All other components within normal limits  SARS CORONAVIRUS 2 (TAT 6-24 HRS)  BASIC METABOLIC PANEL  POC URINE PREG, ED    EKG   Radiology No results found.  Procedures Procedures (including critical care time)  Medications Ordered in UC Medications  ondansetron (ZOFRAN-ODT) disintegrating tablet 4 mg (4 mg Oral Given 06/01/20 1111)  ketorolac (TORADOL) 30 MG/ML injection 30 mg (30 mg Intramuscular Given 06/01/20 1111)    Initial  Impression / Assessment and Plan / UC Course  I have reviewed the triage vital signs and the nursing notes.  Pertinent labs & imaging results that were available during my care of the patient were reviewed by me  and considered in my medical decision making (see chart for details).     Patient feels much improved after toradol and zofran here in clinic. Minimal improvement with blood pressure, down to 150/98. Per chart review has been hypertensive the last few visits, and she endorses elevation at home the past two weeks as well. Opted to initiate hydrochlorothiazide low dose today, she agrees to establishing with a PCP for recheck in the next month. Covid testing pending and isolation instructions provided.  Return precautions provided. Patient verbalized understanding and agreeable to plan.   Final Clinical Impressions(s) / UC Diagnoses   Final diagnoses:  Acute nonintractable headache, unspecified headache type  Hypertension, unspecified type     Discharge Instructions     I have started a low dose medication to help with your blood pressure. This is a diuretic so you likely will notice that you urinate more, which is normal.  See provided information about managing elevated blood pressure at home.  You may take tylenol as needed if persistent headache.  We would call you if your covid test were to come back positive, you may see your result on your mychart as well.  Please follow up with a primary care provider in the next month for a recheck as you may need medication adjustment and/or further evaluation if your symptoms persist or blood pressure hasn't improved.  If any worsening of symptoms please return or go to the ER    ED Prescriptions    Medication Sig Dispense Auth. Provider   hydrochlorothiazide (HYDRODIURIL) 12.5 MG tablet Take 1 tablet (12.5 mg total) by mouth daily. 30 tablet Georgetta Haber, NP     PDMP not reviewed this encounter.   Georgetta Haber, NP 06/01/20  1304

## 2020-06-01 NOTE — ED Notes (Signed)
BP reported to Dr. Hagler 

## 2020-06-01 NOTE — Discharge Instructions (Signed)
I have started a low dose medication to help with your blood pressure. This is a diuretic so you likely will notice that you urinate more, which is normal.  See provided information about managing elevated blood pressure at home.  You may take tylenol as needed if persistent headache.  We would call you if your covid test were to come back positive, you may see your result on your mychart as well.  Please follow up with a primary care provider in the next month for a recheck as you may need medication adjustment and/or further evaluation if your symptoms persist or blood pressure hasn't improved.  If any worsening of symptoms please return or go to the ER

## 2020-06-01 NOTE — ED Triage Notes (Signed)
Pt presents with high blood pressure, headache, nausea and funny feeling in the chest x 2 days. States for the past moth her blood pressure has being  elevated when checking at home, today was 177/117. Reports she has never being diagnosed with high blood pressure. Denies chest pain, numbness/tngling arms, vision changes, diarrhea

## 2020-07-04 ENCOUNTER — Encounter: Payer: Self-pay | Admitting: *Deleted

## 2020-07-04 ENCOUNTER — Ambulatory Visit
Admission: EM | Admit: 2020-07-04 | Discharge: 2020-07-04 | Disposition: A | Discharge status: Home / Self Care | Payer: Self-pay | Attending: Emergency Medicine | Admitting: Emergency Medicine

## 2020-07-04 DIAGNOSIS — L02214 Cutaneous abscess of groin: Secondary | ICD-10-CM

## 2020-07-04 MED ORDER — DOXYCYCLINE HYCLATE 100 MG PO CAPS: 100.0000 mg | ORAL_CAPSULE | Freq: Two times a day (BID) | ORAL | 0 refills | Status: AC

## 2020-07-04 MED ORDER — IBUPROFEN 800 MG PO TABS: 800.0000 mg | ORAL_TABLET | Freq: Three times a day (TID) | ORAL | 0 refills | Status: DC

## 2020-07-04 NOTE — ED Provider Notes (Signed)
[facility]    CSN: [id number] Arrival date & time: 07/04/20  0849      History   Chief Complaint Chief Complaint  Patient presents with  . Abscess    HPI Caroline Hobbs is a 24 y.o. female presents today for evaluation of abscess. Area of swelling and pain to right groin x 2 days. Denies fevers. Denies history of similar. No drainage.  HPI  Past Medical History:  Diagnosis Date  . Acne    Duac gel per Derm, Dr. Nita Sells  . Allergy    mild spring allergies to pollen  . Asthma 2009   No symptoms since 2009. No med use since 2009    Patient Active Problem List   Diagnosis Date Noted  . Allergic rhinitis 12/04/2011  . Allergic conjunctivitis 12/04/2011  . Asthma 12/04/2011  . Acne 12/04/2011    History reviewed. No pertinent surgical history.  OB History   No obstetric history on file.      Home Medications    Prior to Admission medications   Medication Sig Start Date End Date Taking? Authorizing Provider  hydrochlorothiazide (HYDRODIURIL) 12.5 MG tablet Take 1 tablet (12.5 mg total) by mouth daily. 06/01/20  Yes Linus Mako B, NP  doxycycline (VIBRAMYCIN) 100 MG capsule Take 1 capsule (100 mg total) by mouth 2 (two) times daily for 7 days. 07/04/20 07/11/20  Wieters, Hallie C, PA-C  HYDROcodone-acetaminophen (NORCO/VICODIN) 5-325 MG tablet Take 1 tablet by mouth every 6 (six) hours as needed for moderate pain or severe pain. Patient not taking: Reported on 02/29/2020 12/30/19   Mardella Layman, MD  ibuprofen (ADVIL) 800 MG tablet Take 1 tablet (800 mg total) by mouth 3 (three) times daily. 07/04/20   Wieters, Hallie C, PA-C  Levonorgest-Eth Estrad-Fe Bisg (BALCOLTRA) 0.1-20 MG-MCG(21) TABS Take 1 tablet by mouth daily. 02/29/20   Brock Bad, MD  albuterol (VENTOLIN HFA) 108 (90 Base) MCG/ACT inhaler Inhale 2 puffs into the lungs every 4 (four) hours as needed for wheezing or shortness of breath. 05/13/19 12/30/19  Hall-Potvin, [place], PA-C   dicyclomine (BENTYL) 20 MG tablet Take 1 tablet (20 mg total) by mouth 2 (two) times daily. Patient not taking: Reported on 01/14/2015 08/19/13 05/27/15  Reuben Likes, MD  fluticasone [facility]) 50 MCG/ACT nasal spray Place 1 spray into both nostrils daily. 05/13/19 12/30/19  Hall-Potvin, [place], PA-C    Family History Family History  Problem Relation Age of Onset  . Stroke Maternal Grandfather   . Asthma Sister   . Cancer Neg Hx   . Diabetes Neg Hx   . Heart disease Neg Hx   . Hyperlipidemia Neg Hx   . Hypertension Neg Hx     Social History Social History   Tobacco Use  . Smoking status: Never Smoker  . Smokeless tobacco: Never Used  Substance Use Topics  . Alcohol use: No  . Drug use: No     Allergies   Patient has no known allergies.   Review of Systems Review of Systems  Constitutional: Negative for fatigue and fever.  Eyes: Negative for visual disturbance.  Respiratory: Negative for shortness of breath.   Cardiovascular: Negative for chest pain.  Gastrointestinal: Negative for abdominal pain, nausea and vomiting.  Musculoskeletal: Negative for arthralgias and joint swelling.  Skin: Positive for color change. Negative for rash and wound.  Neurological: Negative for dizziness, weakness, light-headedness and headaches.     Physical Exam Triage Vital Signs ED Triage Vitals  Enc Vitals  Group     BP 07/04/20 0928 (!) 144/98     Pulse Rate 07/04/20 0928 84     Resp 07/04/20 0928 15     Temp 07/04/20 0928 98 F (36.7 C)     Temp Source 07/04/20 0928 Oral     SpO2 07/04/20 0928 99 %     Weight --      Height --      Head Circumference --      Peak Flow --      Pain Score 07/04/20 0927 7     Pain Loc --      Pain Edu? --      Excl. in GC? --    No data found.  Updated Vital Signs BP (!) 144/98 (BP Location: Left Arm)   Pulse 84   Temp 98 F (36.7 C) (Oral)   Resp 15   SpO2 99%   Visual Acuity Right Eye Distance:   Left Eye Distance:    Bilateral Distance:    Right Eye Near:   Left Eye Near:    Bilateral Near:     Physical Exam Vitals and nursing note reviewed.  Constitutional:      Appearance: She is well-developed.     Comments: No acute distress  HENT:     Head: Normocephalic and atraumatic.     Nose: Nose normal.  Eyes:     Conjunctiva/sclera: Conjunctivae normal.  Cardiovascular:     Rate and Rhythm: Normal rate.  Pulmonary:     Effort: Pulmonary effort is normal. No respiratory distress.  Abdominal:     General: There is no distension.  Musculoskeletal:        General: Normal range of motion.     Cervical back: Neck supple.  Skin:    General: Skin is warm and dry.     Comments: Right groin with 3 cm area of erythema, swelling, induration and fluctuance.   Neurological:     Mental Status: She is alert and oriented to person, place, and time.      UC Treatments / Results  Labs (all labs ordered are listed, but only abnormal results are displayed) Labs Reviewed - No data to display  EKG   Radiology No results found.  Procedures Incision and Drainage  Date/Time: 07/04/2020 9:55 AM Performed by: Wieters, Anderson C, PA-C Authorized by: Wieters, Junius Creamer, PA-C   Consent:    Consent obtained:  Verbal   Consent given by:  Patient   Risks discussed:  Bleeding, incomplete drainage, pain and infection   Alternatives discussed:  Delayed treatment Location:    Type:  Abscess   Size:  3   Location:  Anogenital   Anogenital location: right groin. Pre-procedure details:    Skin preparation:  Betadine Anesthesia (see MAR for exact dosages):    Anesthesia method:  Local infiltration   Local anesthetic:  Lidocaine 2% WITH epi Procedure details:    Incision types:  Single straight   Incision depth:  Subcutaneous   Scalpel blade:  11   Wound management:  Probed and deloculated   Drainage:  Purulent   Drainage amount:  Moderate   Wound treatment:  Wound left open   Packing materials:  1/4 in  iodoform gauze Post-procedure details:    Patient tolerance of procedure:  Tolerated well, no immediate complications   (including critical care time)  Medications Ordered in UC Medications - No data to display  Initial Impression / Assessment and Plan / UC Course  I have reviewed the triage vital signs and the nursing notes.  Pertinent labs & imaging results that were available during my care of the patient were reviewed by me and considered in my medical decision making (see chart for details).     Abscess to right groin-I&D performed, packing placed.  Patient to have packing removed in approximately 48 hours, begin doxycycline warm compresses and monitor for gradual resolution. Discussed strict return precautions. Patient verbalized understanding and is agreeable with plan.   Final Clinical Impressions(s) / UC Diagnoses   Final diagnoses:  Abscess of right groin     Discharge Instructions     Please begin doxycycline for 10 days  Packing removed in 24-48 hours  Apply warm compresses/hot rags to area with massage to express further drainage especially the first 24-48 hours  Use anti-inflammatories for pain/swelling. You may take up to 800 mg Ibuprofen every 8 hours with food. You may supplement Ibuprofen with Tylenol [phone] mg every 8 hours.    Return if symptoms returning or not improving    ED Prescriptions    Medication Sig Dispense Auth. Provider   doxycycline (VIBRAMYCIN) 100 MG capsule Take 1 capsule (100 mg total) by mouth 2 (two) times daily for 7 days. 14 capsule Wieters, Hallie C, PA-C   ibuprofen (ADVIL) 800 MG tablet Take 1 tablet (800 mg total) by mouth 3 (three) times daily. 21 tablet Wieters, Yancey C, PA-C     PDMP not reviewed this encounter.   Wieters, [place] C, PA-C 07/04/20 1019

## 2020-07-04 NOTE — ED Triage Notes (Signed)
Patient with abscess to right inner thigh, noted it on Sunday. No drainage. No fevers.

## 2020-07-04 NOTE — Discharge Instructions (Addendum)
Please begin doxycycline for 10 days  Packing removed in 24-48 hours  Apply warm compresses/hot rags to area with massage to express further drainage especially the first 24-48 hours  Use anti-inflammatories for pain/swelling. You may take up to 800 mg Ibuprofen every 8 hours with food. You may supplement Ibuprofen with Tylenol [phone] mg every 8 hours.    Return if symptoms returning or not improving

## 2021-10-27 IMAGING — DX DG FOOT COMPLETE 3+V*R*
3 series · 3 of 3 positions shown · non-contrast
Comparison: None.

CLINICAL DATA: Right foot pain, injury

EXAM:
RIGHT FOOT COMPLETE - 3+ VIEW

[foot ap]
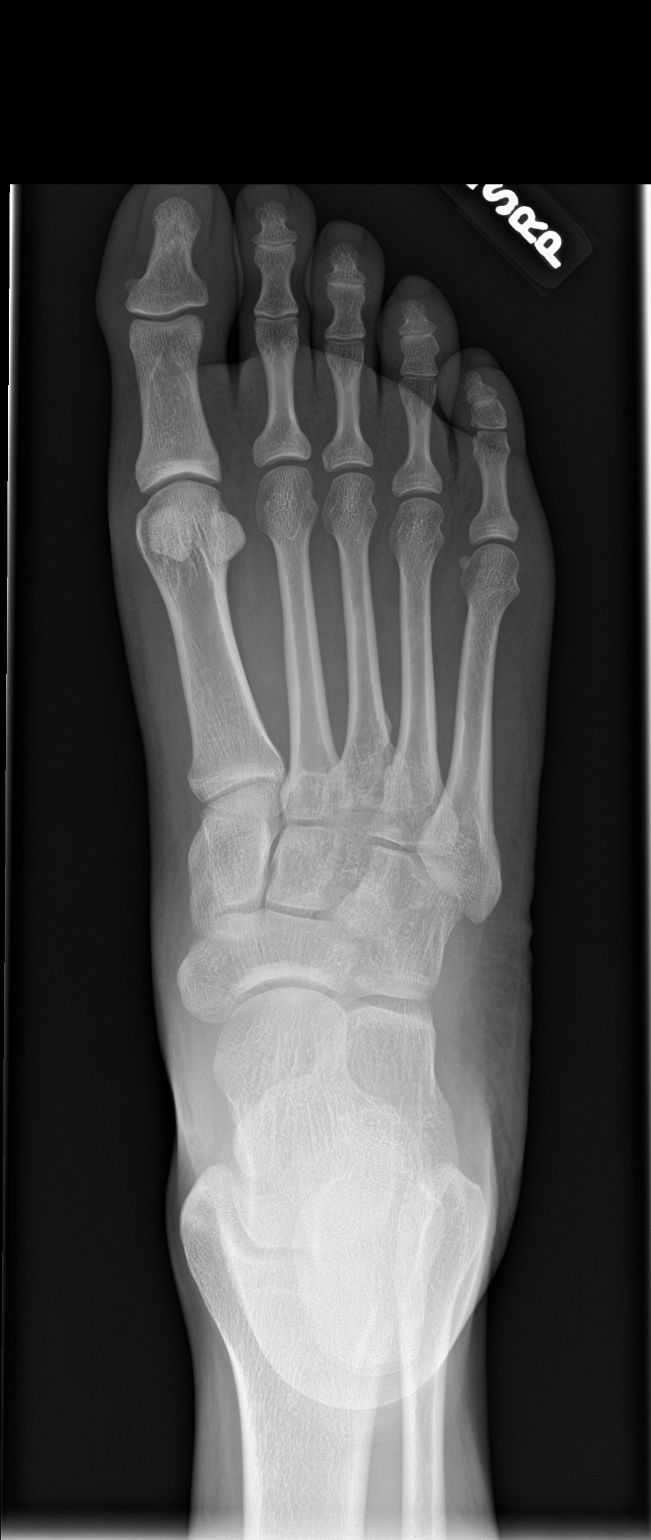

[foot obl]
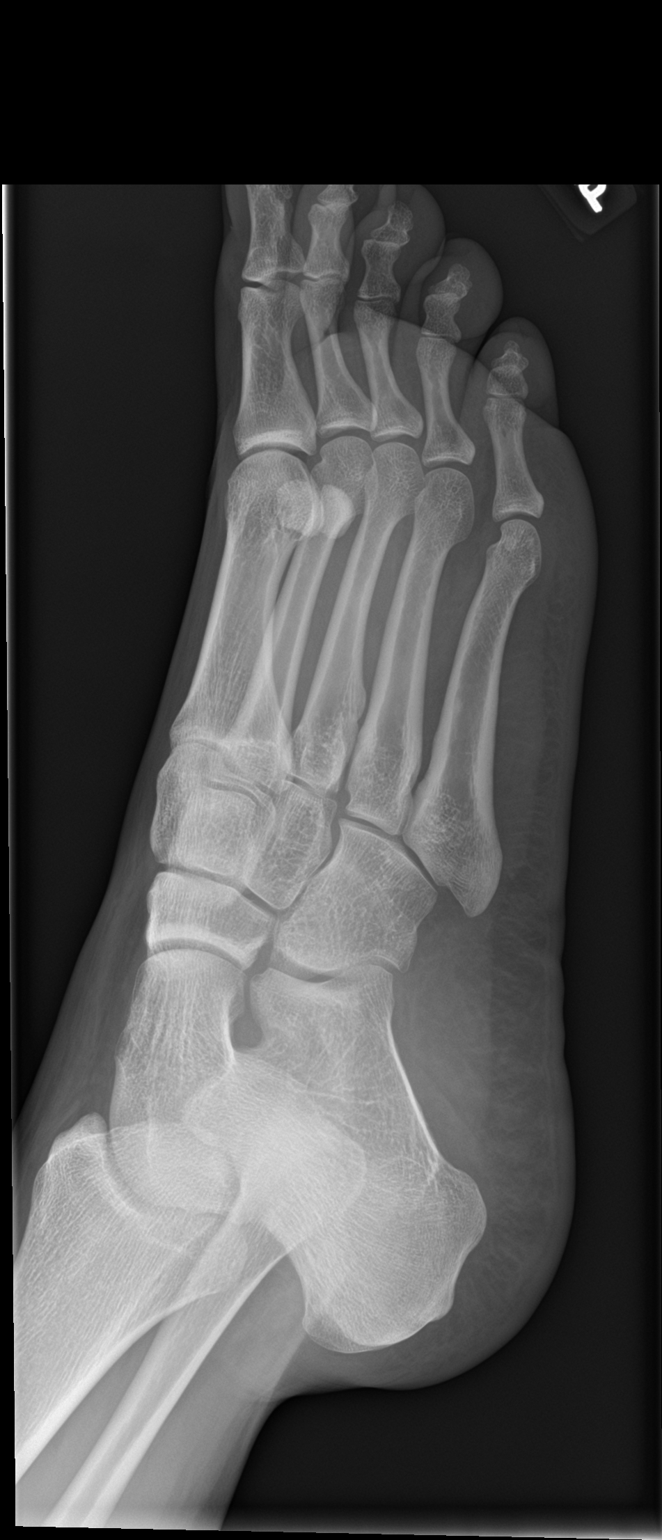

[foot lat]
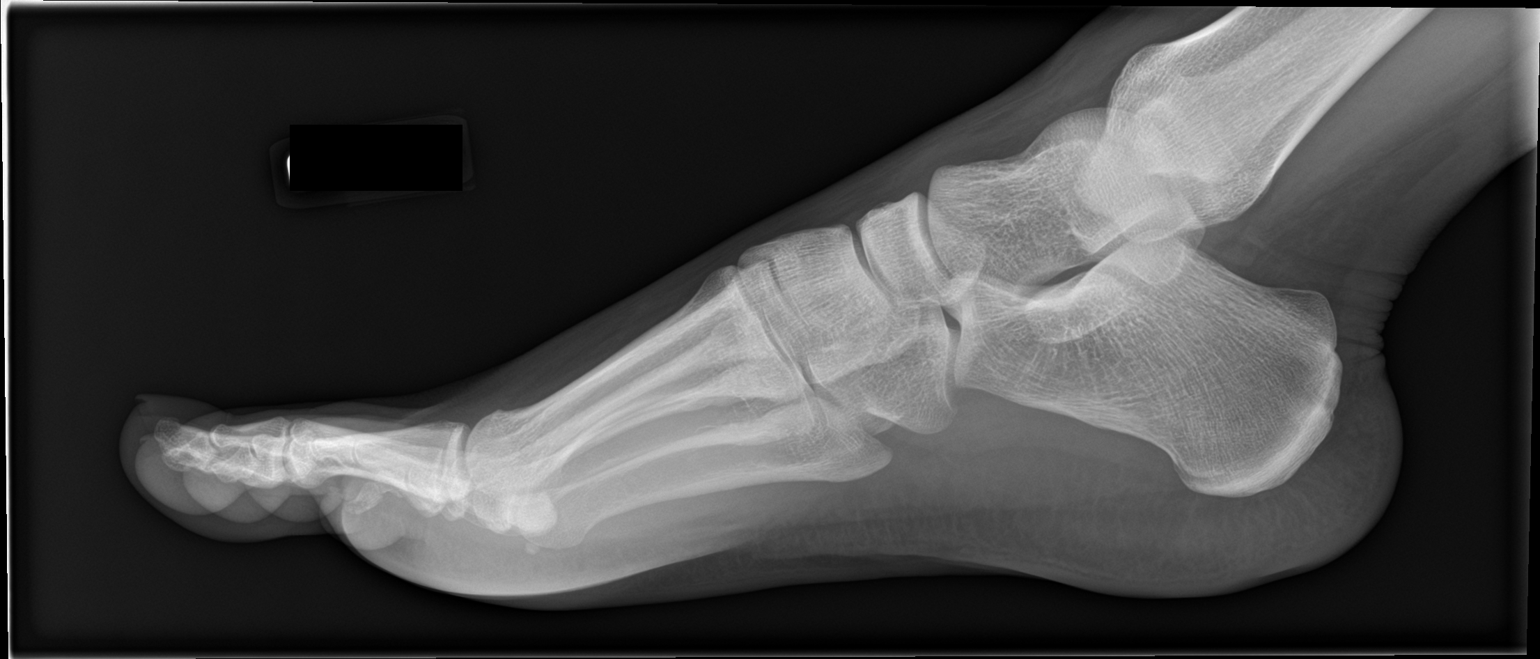

[3 of 3 positions shown; findings below may reference images not displayed]

FINDINGS: There is no evidence of fracture or dislocation. There is no
evidence of arthropathy or other focal bone abnormality. Soft
tissues are unremarkable.
IMPRESSION: Negative.

## 2022-07-09 ENCOUNTER — Ambulatory Visit (INDEPENDENT_AMBULATORY_CARE_PROVIDER_SITE_OTHER): Payer: Self-pay | Admitting: Nurse Practitioner

## 2022-07-09 ENCOUNTER — Encounter: Payer: Self-pay | Admitting: Nurse Practitioner

## 2022-07-09 VITALS — BP 128/78 | HR 80 | Ht 65.0 in | Wt 152.0 lb

## 2022-07-09 DIAGNOSIS — R1013 Epigastric pain: Secondary | ICD-10-CM

## 2022-07-09 MED ORDER — OMEPRAZOLE 20 MG PO CPDR: 20.0000 mg | DELAYED_RELEASE_CAPSULE | Freq: Every day | ORAL | 3 refills | Status: DC

## 2022-07-09 NOTE — Progress Notes (Unsigned)
 @Patient ID: Caroline Hobbs, female    DOB: 12/24/1995, 26 y.o.   MRN: [id number]  Chief Complaint  Patient presents with   New Patient (Initial Visit)    Pt stated--abdominal--have random tightness pain-2 months.    Referring provider: No ref. provider found   HPI  Presents today to establish care.  She states that she has been having abdominal pain for the past 2 months.  She states that the pain is located around the epigastric region.  Patient denies any or constipation.  She denies any blood in the stools.  She denies any vomiting or nausea.  States that she has had a little decreased appetite. Denies f/c/s, n/v/d, hemoptysis, PND, leg swelling Denies chest pain or edema      No Known Allergies  Immunization History  Administered Date(s) Administered   DTaP 03/23/1994, 05/24/1994, 08/15/1994, 02/06/1995   HIB (PRP-OMP) 03/23/1994, 05/24/1994, 08/15/1994, 02/06/1995   HPV Quadrivalent 12/04/2011, 02/14/2012   Hepatitis A 02/14/2012   Hepatitis B 01/14/1994, 03/23/1994, 08/15/1994   IPV 03/23/1994, 05/24/1994, 08/15/1994   Influenza Split 08/14/2011   MMR 02/06/1995, 02/14/2012   Meningococcal Conjugate 02/14/2012   Td 05/31/2006   Tdap 08/14/2011   Varicella 08/14/2011, 12/04/2011    Past Medical History:  Diagnosis Date   Acne    Duac gel per Derm, Dr. John Hall   Allergy    mild spring allergies to pollen   Asthma 2009   No symptoms since 2009. No med use since 2009    Tobacco History: Social History   Tobacco Use  Smoking Status Never  Smokeless Tobacco Never   Counseling given: Not Answered   Outpatient Encounter Medications as of 07/09/2022  Medication Sig   omeprazole (PRILOSEC) 20 MG capsule Take 1 capsule (20 mg total) by mouth daily.   [DISCONTINUED] albuterol (VENTOLIN HFA) 108 (90 Base) MCG/ACT inhaler Inhale 2 puffs into the lungs every 4 (four) hours as needed for wheezing or shortness of breath.   [DISCONTINUED] dicyclomine  (BENTYL) 20 MG tablet Take 1 tablet (20 mg total) by mouth 2 (two) times daily. (Patient not taking: Reported on 01/14/2015)   [DISCONTINUED] fluticasone (FLONASE) 50 MCG/ACT nasal spray Place 1 spray into both nostrils daily.   [DISCONTINUED] hydrochlorothiazide (HYDRODIURIL) 12.5 MG tablet Take 1 tablet (12.5 mg total) by mouth daily.   [DISCONTINUED] HYDROcodone-acetaminophen (NORCO/VICODIN) 5-325 MG tablet Take 1 tablet by mouth every 6 (six) hours as needed for moderate pain or severe pain. (Patient not taking: Reported on 02/29/2020)   [DISCONTINUED] ibuprofen (ADVIL) 800 MG tablet Take 1 tablet (800 mg total) by mouth 3 (three) times daily.   [DISCONTINUED] Levonorgest-Eth Estrad-Fe Bisg (BALCOLTRA) 0.1-20 MG-MCG(21) TABS Take 1 tablet by mouth daily. (Patient not taking: Reported on 07/09/2022)   No facility-administered encounter medications on file as of 07/09/2022.     Review of Systems  Review of Systems  Constitutional: Negative.   HENT: Negative.    Cardiovascular: Negative.   Gastrointestinal:  Positive for abdominal pain.  Allergic/Immunologic: Negative.   Neurological: Negative.   Psychiatric/Behavioral: Negative.         Physical Exam  BP 128/78   Pulse 80   Ht 5' 5" (1.651 m)   Wt 152 lb (68.9 kg)   LMP 06/26/2022   SpO2 100%   BMI 25.29 kg/m   Wt Readings from Last 5 Encounters:  07/09/22 152 lb (68.9 kg)  02/29/20 149 lb (67.6 kg)  12/30/19 145 lb (65.8 kg)  05/27/15 145 lb (65.8   kg)  02/14/12 149 lb (67.6 kg) (83 %, Z= 0.97)*   * Growth percentiles are based on CDC (Girls, 2-20 Years) data.     Physical Exam Vitals and nursing note reviewed.  Constitutional:      General: She is not in acute distress.    Appearance: She is well-developed.  Cardiovascular:     Rate and Rhythm: Normal rate and regular rhythm.  Pulmonary:     Effort: Pulmonary effort is normal.     Breath sounds: Normal breath sounds.  Abdominal:     General: Abdomen is flat.  There is no distension.     Tenderness: There is abdominal tenderness in the epigastric area.  Neurological:     Mental Status: She is alert and oriented to person, place, and time.      Lab Results:  CBC    Component Value Date/Time   WBC 9.0 07/09/2022 1429   WBC 6.4 06/01/2020 1118   RBC 4.82 07/09/2022 1429   RBC 5.22 (H) 06/01/2020 1118   HGB 11.6 07/09/2022 1429   HCT 37.7 07/09/2022 1429   PLT 220 07/09/2022 1429   MCV 78 (L) 07/09/2022 1429   MCH 24.1 (L) 07/09/2022 1429   MCH 24.1 (L) 06/01/2020 1118   MCHC 30.8 (L) 07/09/2022 1429   MCHC 30.8 06/01/2020 1118   RDW 12.7 07/09/2022 1429   LYMPHSABS 11.8 (H) 08/19/2013 1312   MONOABS 0.3 08/19/2013 1312   EOSABS 0.0 08/19/2013 1312   BASOSABS 0.0 08/19/2013 1312    BMET    Component Value Date/Time   NA 139 07/09/2022 1429   K 3.7 07/09/2022 1429   CL 104 07/09/2022 1429   CO2 24 07/09/2022 1429   GLUCOSE 107 (H) 07/09/2022 1429   GLUCOSE 84 06/01/2020 1118   BUN 6 07/09/2022 1429   CREATININE 0.82 07/09/2022 1429   CALCIUM 9.5 07/09/2022 1429   GFRNONAA >60 06/01/2020 1118      Assessment & Plan:   Abdominal pain, epigastric - CBC - Comprehensive metabolic panel - omeprazole (PRILOSEC) 20 MG capsule; Take 1 capsule (20 mg total) by mouth daily.  Dispense: 30 capsule; Refill: 3   Follow up:  Follow up in 1 month     Tonya S Nichols, NP 07/11/2022  

## 2022-07-09 NOTE — Patient Instructions (Addendum)
1. Abdominal pain, epigastric  - CBC - Comprehensive metabolic panel - omeprazole (PRILOSEC) 20 MG capsule; Take 1 capsule (20 mg total) by mouth daily.  Dispense: 30 capsule; Refill: 3   Follow up:  Follow up in 1 month

## 2022-07-10 LAB — CBC
Hematocrit: 37.7 % (ref 34.0–46.6)
Hemoglobin: 11.6 g/dL (ref 11.1–15.9)
MCH: 24.1 pg — ABNORMAL LOW (ref 26.6–33.0)
MCHC: 30.8 g/dL — ABNORMAL LOW (ref 31.5–35.7)
MCV: 78 fL — ABNORMAL LOW (ref 79–97)
Platelets: 220 10*3/uL (ref 150–450)
RBC: 4.82 x10E6/uL (ref 3.77–5.28)
RDW: 12.7 % (ref 11.7–15.4)
WBC: 9 10*3/uL (ref 3.4–10.8)

## 2022-07-10 LAB — COMPREHENSIVE METABOLIC PANEL
ALT: 5 IU/L (ref 0–32)
AST: 11 IU/L (ref 0–40)
Albumin/Globulin Ratio: 1.8 (ref 1.2–2.2)
Albumin: 4.6 g/dL (ref 4.0–5.0)
Alkaline Phosphatase: 56 IU/L (ref 44–121)
BUN/Creatinine Ratio: 7 — ABNORMAL LOW (ref 9–23)
BUN: 6 mg/dL (ref 6–20)
Bilirubin Total: 0.4 mg/dL (ref 0.0–1.2)
CO2: 24 mmol/L (ref 20–29)
Calcium: 9.5 mg/dL (ref 8.7–10.2)
Chloride: 104 mmol/L (ref 96–106)
Creatinine, Ser: 0.82 mg/dL (ref 0.57–1.00)
Globulin, Total: 2.5 g/dL (ref 1.5–4.5)
Glucose: 107 mg/dL — ABNORMAL HIGH (ref 70–99)
Potassium: 3.7 mmol/L (ref 3.5–5.2)
Sodium: 139 mmol/L (ref 134–144)
Total Protein: 7.1 g/dL (ref 6.0–8.5)
eGFR: 100 mL/min/{1.73_m2} (ref >59)

## 2022-07-11 ENCOUNTER — Encounter: Payer: Self-pay | Admitting: Nurse Practitioner

## 2022-07-11 DIAGNOSIS — R1013 Epigastric pain: Secondary | ICD-10-CM | POA: Insufficient documentation

## 2022-07-11 NOTE — Assessment & Plan Note (Signed)
-   CBC - Comprehensive metabolic panel - omeprazole (PRILOSEC) 20 MG capsule; Take 1 capsule (20 mg total) by mouth daily.  Dispense: 30 capsule; Refill: 3   Follow up:  Follow up in 1 month

## 2022-07-25 ENCOUNTER — Telehealth: Payer: Self-pay | Admitting: Nurse Practitioner

## 2022-07-25 NOTE — Telephone Encounter (Signed)
Please call regarding lab results

## 2022-07-26 NOTE — Telephone Encounter (Signed)
Pt was called and advised to wait until we get a note from the doctor. KH

## 2022-07-26 NOTE — Telephone Encounter (Signed)
Done KH 

## 2022-08-06 ENCOUNTER — Ambulatory Visit: Payer: Self-pay | Admitting: Nurse Practitioner

## 2022-10-24 ENCOUNTER — Other Ambulatory Visit: Payer: Self-pay | Admitting: Nurse Practitioner

## 2022-10-24 DIAGNOSIS — L732 Hidradenitis suppurativa: Secondary | ICD-10-CM

## 2023-01-13 ENCOUNTER — Other Ambulatory Visit: Payer: Self-pay | Admitting: Nurse Practitioner

## 2023-01-13 DIAGNOSIS — R1013 Epigastric pain: Secondary | ICD-10-CM

## 2023-01-28 ENCOUNTER — Other Ambulatory Visit: Payer: Self-pay | Admitting: Nurse Practitioner

## 2023-01-28 DIAGNOSIS — R1013 Epigastric pain: Secondary | ICD-10-CM

## 2023-12-26 DIAGNOSIS — E282 Polycystic ovarian syndrome: Secondary | ICD-10-CM | POA: Insufficient documentation

## 2024-05-14 ENCOUNTER — Ambulatory Visit
Admission: RE | Admit: 2024-05-14 | Discharge: 2024-05-14 | Disposition: A | Discharge status: Home / Self Care | Source: Ambulatory Visit | Attending: Family Medicine | Admitting: Family Medicine

## 2024-05-14 VITALS — BP 125/83 | HR 72 | Temp 98.9°F | Resp 16

## 2024-05-14 DIAGNOSIS — N760 Acute vaginitis: Secondary | ICD-10-CM | POA: Insufficient documentation

## 2024-05-14 DIAGNOSIS — L0291 Cutaneous abscess, unspecified: Secondary | ICD-10-CM

## 2024-05-14 HISTORY — DX: Hidradenitis suppurativa: L73.2

## 2024-05-14 MED ORDER — KETOROLAC TROMETHAMINE 10 MG PO TABS: 10.0000 mg | ORAL_TABLET | Freq: Four times a day (QID) | ORAL | 0 refills | Status: AC | PRN

## 2024-05-14 MED ORDER — CEPHALEXIN 500 MG PO CAPS: 500.0000 mg | ORAL_CAPSULE | Freq: Two times a day (BID) | ORAL | 0 refills | Status: AC

## 2024-05-14 MED ORDER — KETOROLAC TROMETHAMINE 30 MG/ML IJ SOLN
30.0000 mg | Freq: Once | INTRAMUSCULAR | Status: AC
  Administered 2024-05-14: 30 mg via INTRAMUSCULAR

## 2024-05-14 NOTE — ED Triage Notes (Signed)
 Pt reports cyst to R inguinal area x2 days. Reports hx of HS. Notes she was walking around for awhile wearing a skirt and wonders if this irritated the area. Has only taken tylenol . No topicals applied to area.

## 2024-05-14 NOTE — ED Provider Notes (Signed)
 [facility]    CSN: [id number] Arrival date & time: 05/14/24  1602      History   Chief Complaint Chief Complaint  Patient presents with   Cyst    HPI Caroline Hobbs is a 28 y.o. female.   HPI Here for a bump and discomfort in her right inguinal area.  He has been there for about 2 days.  No fever.   she has had abscesses in other places before.    NKDA   last menstrual cycle September 26  Past Medical History:  Diagnosis Date   Acne    Duac gel per Derm, Dr. Norleen Hurst   Allergy    mild spring allergies to pollen   Asthma 07/31/2007   No symptoms since 2009. No med use since 2009   Hidradenitis suppurativa     Patient Active Problem List   Diagnosis Date Noted   Vaginitis and vulvovaginitis 05/14/2024   PCOS (polycystic ovarian syndrome) 12/26/2023   Abdominal pain, epigastric 07/11/2022   Allergic rhinitis 12/04/2011   Allergic conjunctivitis 12/04/2011   Asthma 12/04/2011   Acne 12/04/2011    History reviewed. No pertinent surgical history.  OB History   No obstetric history on file.      Home Medications    Prior to Admission medications   Medication Sig Start Date End Date Taking? Authorizing Provider  cephALEXin  (KEFLEX ) 500 MG capsule Take 1 capsule (500 mg total) by mouth 2 (two) times daily for 7 days. 05/14/24 05/21/24 Yes Lindsee Labarre, Sharlet POUR, MD  ketorolac  (TORADOL ) 10 MG tablet Take 1 tablet (10 mg total) by mouth every 6 (six) hours as needed (pain). 05/14/24  Yes Vonna Sharlet POUR, MD  albuterol  (VENTOLIN  HFA) 108 (90 Base) MCG/ACT inhaler Inhale 2 puffs into the lungs every 4 (four) hours as needed for wheezing or shortness of breath. 05/13/19 12/30/19  Hall-Potvin, [place], PA-C  dicyclomine  (BENTYL ) 20 MG tablet Take 1 tablet (20 mg total) by mouth 2 (two) times daily. Patient not taking: Reported on 01/14/2015 08/19/13 05/27/15  Sonda Alm BROCKS, MD  fluticasone  (FLONASE ) 50 MCG/ACT nasal spray Place 1 spray into both  nostrils daily. 05/13/19 12/30/19  Hall-Potvin, [place], PA-C    Family History Family History  Problem Relation Age of Onset   Stroke Maternal Grandfather    Asthma Sister    Cancer Neg Hx    Diabetes Neg Hx    Heart disease Neg Hx    Hyperlipidemia Neg Hx    Hypertension Neg Hx     Social History Social History   Tobacco Use   Smoking status: Never    Passive exposure: Never   Smokeless tobacco: Never  Vaping Use   Vaping status: Never Used  Substance Use Topics   Alcohol use: No   Drug use: No     Allergies   Patient has no known allergies.   Review of Systems Review of Systems   Physical Exam Triage Vital Signs ED Triage Vitals [05/14/24 1658]  Encounter Vitals Group     BP 125/83     Girls Systolic BP Percentile      Girls Diastolic BP Percentile      Boys Systolic BP Percentile      Boys Diastolic BP Percentile      Pulse Rate 72     Resp 16     Temp 98.9 F (37.2 C)     Temp Source Oral     SpO2 99 %  Weight      Height      Head Circumference      Peak Flow      Pain Score 6     Pain Loc      Pain Education      Exclude from Growth Chart    No data found.  Updated Vital Signs BP 125/83 (BP Location: Left Arm)   Pulse 72   Temp 98.9 F (37.2 C) (Oral)   Resp 16   LMP 04/24/2024 (Exact Date)   SpO2 99%   Visual Acuity Right Eye Distance:   Left Eye Distance:   Bilateral Distance:    Right Eye Near:   Left Eye Near:    Bilateral Near:     Physical Exam Vitals reviewed.  Constitutional:      General: She is not in acute distress.    Appearance: She is not ill-appearing, toxic-appearing or diaphoretic.  Skin:    Coloration: Skin is not pale.     Comments: On the right proximal thigh near the right inguinal crease there is an area of erythema and induration and tenderness.  It is about 2 cm in diameter.  There is fluctuance in the central part.  Neurological:     Mental Status: She is alert and oriented to person, place,  and time.  Psychiatric:        Behavior: Behavior normal.      UC Treatments / Results  Labs (all labs ordered are listed, but only abnormal results are displayed) Labs Reviewed - No data to display  EKG   Radiology No results found.  Procedures Procedures (including critical care time)  Medications Ordered in UC Medications  ketorolac  (TORADOL ) 30 MG/ML injection 30 mg (30 mg Intramuscular Given 05/14/24 1738)    Initial Impression / Assessment and Plan / UC Course  I have reviewed the triage vital signs and the nursing notes.  Pertinent labs & imaging results that were available during my care of the patient were reviewed by me and considered in my medical decision making (see chart for details).     Risks and benefits of I&D are discussed and she gives verbal consent.  1% lidocaine plain is used to inject the roof of the abscess. Under clean conditions #11 blade is used to make a stab wound and about 2 mL of purulent material was obtained.  EBL less than 1 mL.  No complications.  Dressing is applied.  We discussed wound care.  Toradol  is given here and Toradol  tablets are sent to the pharmacy.  Keflex  is sent for the infection. Final Clinical Impressions(s) / UC Diagnoses   Final diagnoses:  Abscess     Discharge Instructions      You have been given a shot of Toradol  30 mg today.  Ketorolac  10 mg tablets--take 1 tablet every 6 hours as needed for pain.  This is the same medicine that is in the shot we just gave you  Cephalexin  500 mg --1 tablet by mouth 2 times daily for 7 days.  This is the antibiotic  1-2 times daily wash the area with soapy water and then put a clean dry bandage on.     ED Prescriptions     Medication Sig Dispense Auth. Provider   cephALEXin  (KEFLEX ) 500 MG capsule Take 1 capsule (500 mg total) by mouth 2 (two) times daily for 7 days. 14 capsule Nelline Lio K, MD   ketorolac  (TORADOL ) 10 MG tablet Take 1 tablet (10  mg  total) by mouth every 6 (six) hours as needed (pain). 20 tablet Darshana Curnutt K, MD      PDMP not reviewed this encounter.   Vonna Sharlet POUR, MD 05/14/24 [phone]

## 2024-05-14 NOTE — Discharge Instructions (Signed)
 You have been given a shot of Toradol  30 mg today.  Ketorolac  10 mg tablets--take 1 tablet every 6 hours as needed for pain.  This is the same medicine that is in the shot we just gave you  Cephalexin  500 mg --1 tablet by mouth 2 times daily for 7 days.  This is the antibiotic  1-2 times daily wash the area with soapy water and then put a clean dry bandage on.
